# Patient Record
Sex: Female | Born: 1965 | Hispanic: No | Marital: Married | Smoking: Never smoker
Health system: Southern US, Community
[De-identification: ages and names within clinical notes are randomized; demographics above are authoritative.]

## PROBLEM LIST (undated history)

## (undated) DIAGNOSIS — R51 Headache: Secondary | ICD-10-CM

## (undated) DIAGNOSIS — Z973 Presence of spectacles and contact lenses: Secondary | ICD-10-CM

## (undated) DIAGNOSIS — R519 Headache, unspecified: Secondary | ICD-10-CM

## (undated) DIAGNOSIS — J189 Pneumonia, unspecified organism: Secondary | ICD-10-CM

## (undated) DIAGNOSIS — F419 Anxiety disorder, unspecified: Secondary | ICD-10-CM

## (undated) DIAGNOSIS — T7840XA Allergy, unspecified, initial encounter: Secondary | ICD-10-CM

## (undated) DIAGNOSIS — K219 Gastro-esophageal reflux disease without esophagitis: Secondary | ICD-10-CM

## (undated) HISTORY — DX: Allergy, unspecified, initial encounter: T78.40XA

---

## 2005-01-27 ENCOUNTER — Encounter: Admission: RE | Admit: 2005-01-27 | Discharge: 2005-01-27 | Payer: Self-pay | Admitting: Otolaryngology

## 2006-08-29 ENCOUNTER — Ambulatory Visit (HOSPITAL_COMMUNITY): Admission: RE | Admit: 2006-08-29 | Discharge: 2006-08-29 | Payer: Self-pay | Admitting: Chiropractic Medicine

## 2007-08-28 ENCOUNTER — Ambulatory Visit: Payer: Self-pay | Admitting: Family Medicine

## 2008-04-21 ENCOUNTER — Encounter: Admission: RE | Admit: 2008-04-21 | Discharge: 2008-04-21 | Payer: Self-pay | Admitting: Family Medicine

## 2008-09-30 ENCOUNTER — Encounter (INDEPENDENT_AMBULATORY_CARE_PROVIDER_SITE_OTHER): Payer: Self-pay | Admitting: Internal Medicine

## 2008-09-30 ENCOUNTER — Ambulatory Visit: Payer: Self-pay | Admitting: Internal Medicine

## 2008-09-30 LAB — CONVERTED CEMR LAB
ALT: 11 units/L (ref 0–35)
AST: 10 units/L (ref 0–37)
Albumin: 4.8 g/dL (ref 3.5–5.2)
Alkaline Phosphatase: 63 units/L (ref 39–117)
BUN: 11 mg/dL (ref 6–23)
CO2: 25 meq/L (ref 19–32)
Calcium: 9.8 mg/dL (ref 8.4–10.5)
Chloride: 102 meq/L (ref 96–112)
Cholesterol: 201 mg/dL — ABNORMAL HIGH (ref 0–200)
Creatinine, Ser: 0.75 mg/dL (ref 0.40–1.20)
Glucose, Bld: 86 mg/dL (ref 70–99)
HDL: 69 mg/dL (ref >39)
LDL Cholesterol: 107 mg/dL — ABNORMAL HIGH (ref 0–99)
Potassium: 4.1 meq/L (ref 3.5–5.3)
Sodium: 139 meq/L (ref 135–145)
TSH: 0.999 microintl units/mL (ref 0.350–4.500)
Total Bilirubin: 0.6 mg/dL (ref 0.3–1.2)
Total CHOL/HDL Ratio: 2.9
Total Protein: 7.6 g/dL (ref 6.0–8.3)
Triglycerides: 126 mg/dL (ref <150)
VLDL: 25 mg/dL (ref 0–40)

## 2008-10-28 ENCOUNTER — Ambulatory Visit: Payer: Self-pay | Admitting: Internal Medicine

## 2008-11-04 ENCOUNTER — Ambulatory Visit (HOSPITAL_COMMUNITY): Admission: RE | Admit: 2008-11-04 | Discharge: 2008-11-04 | Payer: Self-pay | Admitting: Internal Medicine

## 2009-01-28 ENCOUNTER — Ambulatory Visit: Payer: Self-pay | Admitting: Internal Medicine

## 2009-01-29 ENCOUNTER — Encounter (INDEPENDENT_AMBULATORY_CARE_PROVIDER_SITE_OTHER): Payer: Self-pay | Admitting: Internal Medicine

## 2009-02-04 ENCOUNTER — Ambulatory Visit: Payer: Self-pay | Admitting: *Deleted

## 2009-04-07 ENCOUNTER — Ambulatory Visit: Payer: Self-pay | Admitting: Obstetrics and Gynecology

## 2009-05-05 ENCOUNTER — Ambulatory Visit: Payer: Self-pay | Admitting: Internal Medicine

## 2009-07-08 ENCOUNTER — Ambulatory Visit: Payer: Self-pay | Admitting: Obstetrics and Gynecology

## 2009-07-19 ENCOUNTER — Encounter: Admission: RE | Admit: 2009-07-19 | Discharge: 2009-07-19 | Payer: Self-pay | Admitting: Obstetrics and Gynecology

## 2009-08-20 ENCOUNTER — Encounter: Admission: RE | Admit: 2009-08-20 | Discharge: 2009-08-20 | Payer: Self-pay | Admitting: Infectious Diseases

## 2009-11-05 ENCOUNTER — Emergency Department (HOSPITAL_COMMUNITY): Admission: EM | Admit: 2009-11-05 | Discharge: 2009-11-05 | Payer: Self-pay | Admitting: Emergency Medicine

## 2010-07-17 ENCOUNTER — Encounter: Payer: Self-pay | Admitting: *Deleted

## 2010-07-17 ENCOUNTER — Encounter: Payer: Self-pay | Admitting: Family Medicine

## 2010-11-07 ENCOUNTER — Other Ambulatory Visit: Payer: Self-pay | Admitting: Family Medicine

## 2010-11-07 DIAGNOSIS — Z1231 Encounter for screening mammogram for malignant neoplasm of breast: Secondary | ICD-10-CM

## 2010-11-14 ENCOUNTER — Other Ambulatory Visit: Payer: Self-pay | Admitting: Family Medicine

## 2010-11-14 ENCOUNTER — Ambulatory Visit
Admission: RE | Admit: 2010-11-14 | Discharge: 2010-11-14 | Disposition: A | Discharge status: Home / Self Care | Payer: 59 | Source: Ambulatory Visit | Attending: Family Medicine | Admitting: Family Medicine

## 2010-11-14 DIAGNOSIS — Z1231 Encounter for screening mammogram for malignant neoplasm of breast: Secondary | ICD-10-CM

## 2011-03-20 ENCOUNTER — Ambulatory Visit
Admission: RE | Admit: 2011-03-20 | Discharge: 2011-03-20 | Disposition: A | Discharge status: Home / Self Care | Payer: PRIVATE HEALTH INSURANCE | Source: Ambulatory Visit | Attending: Infectious Diseases | Admitting: Infectious Diseases

## 2011-03-20 ENCOUNTER — Other Ambulatory Visit: Payer: Self-pay | Admitting: Infectious Diseases

## 2011-03-20 DIAGNOSIS — R9389 Abnormal findings on diagnostic imaging of other specified body structures: Secondary | ICD-10-CM

## 2011-10-31 ENCOUNTER — Other Ambulatory Visit: Payer: Self-pay | Admitting: Family Medicine

## 2011-10-31 DIAGNOSIS — Z1231 Encounter for screening mammogram for malignant neoplasm of breast: Secondary | ICD-10-CM

## 2011-11-28 ENCOUNTER — Other Ambulatory Visit (HOSPITAL_COMMUNITY): Payer: Self-pay | Admitting: Gastroenterology

## 2011-11-28 DIAGNOSIS — R1032 Left lower quadrant pain: Secondary | ICD-10-CM

## 2011-11-30 ENCOUNTER — Other Ambulatory Visit (HOSPITAL_COMMUNITY): Payer: Self-pay | Admitting: Gastroenterology

## 2011-11-30 ENCOUNTER — Ambulatory Visit (HOSPITAL_COMMUNITY)
Admission: RE | Admit: 2011-11-30 | Discharge: 2011-11-30 | Disposition: A | Discharge status: Home / Self Care | Payer: 59 | Source: Ambulatory Visit | Attending: Gastroenterology | Admitting: Gastroenterology

## 2011-11-30 DIAGNOSIS — R1032 Left lower quadrant pain: Secondary | ICD-10-CM

## 2011-11-30 DIAGNOSIS — Z79899 Other long term (current) drug therapy: Secondary | ICD-10-CM | POA: Insufficient documentation

## 2011-12-04 ENCOUNTER — Ambulatory Visit (HOSPITAL_COMMUNITY)
Admission: RE | Admit: 2011-12-04 | Discharge: 2011-12-04 | Disposition: A | Discharge status: Home / Self Care | Payer: 59 | Source: Ambulatory Visit | Attending: Gastroenterology | Admitting: Gastroenterology

## 2011-12-04 ENCOUNTER — Ambulatory Visit: Payer: PRIVATE HEALTH INSURANCE

## 2011-12-04 DIAGNOSIS — R1032 Left lower quadrant pain: Secondary | ICD-10-CM | POA: Insufficient documentation

## 2011-12-07 ENCOUNTER — Ambulatory Visit
Admission: RE | Admit: 2011-12-07 | Discharge: 2011-12-07 | Disposition: A | Discharge status: Home / Self Care | Payer: 59 | Source: Ambulatory Visit | Attending: Family Medicine | Admitting: Family Medicine

## 2011-12-07 DIAGNOSIS — Z1231 Encounter for screening mammogram for malignant neoplasm of breast: Secondary | ICD-10-CM

## 2012-07-29 ENCOUNTER — Other Ambulatory Visit: Payer: Self-pay | Admitting: Family Medicine

## 2012-07-29 ENCOUNTER — Ambulatory Visit
Admission: RE | Admit: 2012-07-29 | Discharge: 2012-07-29 | Disposition: A | Discharge status: Home / Self Care | Payer: 59 | Source: Ambulatory Visit | Attending: Family Medicine | Admitting: Family Medicine

## 2012-07-29 DIAGNOSIS — R61 Generalized hyperhidrosis: Secondary | ICD-10-CM

## 2012-12-02 ENCOUNTER — Other Ambulatory Visit (HOSPITAL_BASED_OUTPATIENT_CLINIC_OR_DEPARTMENT_OTHER): Payer: Self-pay | Admitting: Family Medicine

## 2012-12-02 DIAGNOSIS — Z1231 Encounter for screening mammogram for malignant neoplasm of breast: Secondary | ICD-10-CM

## 2012-12-09 ENCOUNTER — Other Ambulatory Visit: Payer: Self-pay

## 2012-12-09 ENCOUNTER — Ambulatory Visit (HOSPITAL_BASED_OUTPATIENT_CLINIC_OR_DEPARTMENT_OTHER): Payer: 59

## 2012-12-09 ENCOUNTER — Ambulatory Visit
Admission: RE | Admit: 2012-12-09 | Discharge: 2012-12-09 | Disposition: A | Discharge status: Home / Self Care | Payer: 59 | Source: Ambulatory Visit

## 2012-12-09 DIAGNOSIS — Z1231 Encounter for screening mammogram for malignant neoplasm of breast: Secondary | ICD-10-CM

## 2012-12-10 ENCOUNTER — Other Ambulatory Visit: Payer: Self-pay | Admitting: Family Medicine

## 2012-12-10 DIAGNOSIS — R928 Other abnormal and inconclusive findings on diagnostic imaging of breast: Secondary | ICD-10-CM

## 2012-12-24 ENCOUNTER — Ambulatory Visit
Admission: RE | Admit: 2012-12-24 | Discharge: 2012-12-24 | Disposition: A | Discharge status: Home / Self Care | Payer: 59 | Source: Ambulatory Visit | Attending: Family Medicine | Admitting: Family Medicine

## 2012-12-24 DIAGNOSIS — R928 Other abnormal and inconclusive findings on diagnostic imaging of breast: Secondary | ICD-10-CM

## 2013-07-30 ENCOUNTER — Ambulatory Visit (INDEPENDENT_AMBULATORY_CARE_PROVIDER_SITE_OTHER): Payer: BC Managed Care – PPO | Admitting: Physician Assistant

## 2013-07-30 VITALS — BP 110/70 | HR 88 | Temp 98.1°F | Resp 16 | Ht 64.25 in | Wt 110.0 lb

## 2013-07-30 DIAGNOSIS — R69 Illness, unspecified: Principal | ICD-10-CM

## 2013-07-30 DIAGNOSIS — R05 Cough: Secondary | ICD-10-CM

## 2013-07-30 DIAGNOSIS — R059 Cough, unspecified: Secondary | ICD-10-CM

## 2013-07-30 DIAGNOSIS — J111 Influenza due to unidentified influenza virus with other respiratory manifestations: Secondary | ICD-10-CM

## 2013-07-30 DIAGNOSIS — R509 Fever, unspecified: Secondary | ICD-10-CM

## 2013-07-30 LAB — POCT CBC
Granulocyte percent: 64.3 %G (ref 37–80)
HCT, POC: 36.4 % — AB (ref 37.7–47.9)
Hemoglobin: 10.9 g/dL — AB (ref 12.2–16.2)
Lymph, poc: 1.8 (ref 0.6–3.4)
MCH, POC: 26 pg — AB (ref 27–31.2)
MCHC: 29.9 g/dL — AB (ref 31.8–35.4)
MCV: 86.7 fL (ref 80–97)
MID (cbc): 0.5 (ref 0–0.9)
MPV: 8.3 fL (ref 0–99.8)
POC Granulocyte: 4.2 (ref 2–6.9)
POC LYMPH PERCENT: 27.3 %L (ref 10–50)
POC MID %: 8.4 %M (ref 0–12)
Platelet Count, POC: 303 10*3/uL (ref 142–424)
RBC: 4.2 M/uL (ref 4.04–5.48)
RDW, POC: 14.1 %
WBC: 6.5 10*3/uL (ref 4.6–10.2)

## 2013-07-30 LAB — POCT INFLUENZA A/B
Influenza A, POC: NEGATIVE
Influenza B, POC: NEGATIVE

## 2013-07-30 MED ORDER — IPRATROPIUM BROMIDE 0.06 % NA SOLN: 2.0000 | Freq: Three times a day (TID) | NASAL | Status: DC

## 2013-07-30 MED ORDER — HYDROCODONE-HOMATROPINE 5-1.5 MG/5ML PO SYRP: ORAL_SOLUTION | ORAL | Status: DC

## 2013-07-30 MED ORDER — FLUTICASONE PROPIONATE 50 MCG/ACT NA SUSP: 2.0000 | Freq: Every day | NASAL | Status: AC

## 2013-07-30 MED ORDER — OSELTAMIVIR PHOSPHATE 75 MG PO CAPS: 75.0000 mg | ORAL_CAPSULE | Freq: Two times a day (BID) | ORAL | Status: DC

## 2013-07-30 NOTE — Progress Notes (Signed)
Subjective:    Patient ID: Alexis Rose, female    DOB: 1965/11/05, 48 y.o.   MRN: [id number]  HPI 48 year old female presents with a 7 day history of nasal congestion, post nasal drip, sore throat, and cough. Mild sinus pressure. Subjective fever and chills. Nasal congestion thick and green/yellow. Cough is productive of green/yellow sputum and not associated with time of day. Ears feel full, leading to sensation of muffled hearing. Has tried OTC cold preps without success. No GI complaints. Appetite normal. Son has been sick with influenza with influenza 1 week ago. Husband here being seen now as well with influenza-like symptoms.   No recent antibiotics, or recent travels.   No leg trauma, sedentary periods, h/o cancer, or tobacco use.  PMH: Past Medical History  Diagnosis Date  . Allergy     Home Meds: Prior to Admission medications   Medication Sig Start Date End Date Taking? Authorizing Provider  cetirizine (ZYRTEC) 10 MG tablet Take 10 mg by mouth daily.   Yes Historical Provider, MD    Allergies: No Known Allergies  History   Social History  . Marital Status: Married    Spouse Name: N/A    Number of Children: N/A  . Years of Education: N/A   Occupational History  . Not on file.   Social History Main Topics  . Smoking status: Never Smoker   . Smokeless tobacco: Not on file  . Alcohol Use: No  . Drug Use: No  . Sexual Activity: Not on file   Other Topics Concern  . Not on file   Social History Narrative  . No narrative on file      Review of Systems  Constitutional: Positive for fever, chills and fatigue. Negative for appetite change.  HENT: Positive for congestion, ear pain, hearing loss, postnasal drip, rhinorrhea, sinus pressure and sore throat.        Nasal congestion.  Frontal sinus pressure.  Mild left otalgia.   Respiratory: Positive for cough and wheezing. Negative for shortness of breath.        Cough is productive of yellow sputum. Cough  is not associated with time of day.   Gastrointestinal: Positive for nausea. Negative for vomiting and diarrhea.  Musculoskeletal: Positive for myalgias.  Allergic/Immunologic: Positive for environmental allergies.  Neurological: Positive for headaches.       Objective:   Physical Exam  Physical Exam: Blood pressure 110/70, pulse 88, temperature 98.1 F (36.7 C), temperature source Oral, resp. rate 16, height 5' 4.25" (1.632 m), weight 110 lb (49.896 kg), SpO2 98.00%., Body mass index is 18.73 kg/(m^2). General: Well developed, well nourished, in no acute distress. Head: Normocephalic, atraumatic, eyes without discharge, sclera non-icteric, nares are congested. Bilateral auditory canals clear, TM's are without perforation, pearly grey with reflective cone of light bilaterally. No sinus TTP. Oral cavity moist, dentition normal. Posterior pharynx with post nasal drip and mild erythema. No peritonsillar abscess or tonsillar exudate. Uvula midline.  Neck: Supple. No thyromegaly. Full ROM. No lymphadenopathy. Lungs: Clear to auscultation bilaterally without wheezes, rales, or rhonchi. Breathing is unlabored.  Heart: RRR with S1 S2. No murmurs, rubs, or gallops appreciated. Msk:  Strength and tone normal for age. Extremities: No clubbing or cyanosis. No edema. Neuro: Alert and oriented X 3. Moves all extremities spontaneously. CNII-XII grossly in tact. Psych:  Responds to questions appropriately with a normal affect.   Labs: Results for orders placed in visit on 07/30/13  POCT INFLUENZA A/B  Result Value Range   Influenza A, POC Negative     Influenza B, POC Negative    POCT CBC      Result Value Range   WBC 6.5  4.6 - 10.2 K/uL   Lymph, poc 1.8  0.6 - 3.4   POC LYMPH PERCENT 27.3  10 - 50 %L   MID (cbc) 0.5  0 - 0.9   POC MID % 8.4  0 - 12 %M   POC Granulocyte 4.2  2 - 6.9   Granulocyte percent 64.3  37 - 80 %G   RBC 4.20  4.04 - 5.48 M/uL   Hemoglobin 10.9 (*) 12.2 - 16.2 g/dL    HCT, POC 36.4 (*) 37.7 - 47.9 %   MCV 86.7  80 - 97 fL   MCH, POC 26.0 (*) 27 - 31.2 pg   MCHC 29.9 (*) 31.8 - 35.4 g/dL   RDW, POC 14.1     Platelet Count, POC 303  142 - 424 K/uL   MPV 8.3  0 - 99.8 fL       Assessment & Plan:  48 year old female with influenza-like illness, cough, and fever -Tamiflu 75 mg 1 po bid #10 no RF -Hycodan #4oz 1 tsp po q 4-6 hours prn cough no RF SED -Atrovent NS 0.06% 2 sprays each nare bid prn #1 no RF -Flonase 2 sprays each nare daily #1 RF 5 -Tylenol/Motrin -Rest/fluids -RTC precautions    Christell Faith, MHS, PA-C [facility] [place], [place] [address] [place] 07/30/2013 3:31 PM

## 2014-01-12 ENCOUNTER — Ambulatory Visit (INDEPENDENT_AMBULATORY_CARE_PROVIDER_SITE_OTHER): Payer: BC Managed Care – PPO | Admitting: Family Medicine

## 2014-01-12 VITALS — BP 106/64 | HR 78 | Temp 98.4°F | Resp 18 | Ht 64.75 in | Wt 112.8 lb

## 2014-01-12 DIAGNOSIS — J0120 Acute ethmoidal sinusitis, unspecified: Secondary | ICD-10-CM

## 2014-01-12 DIAGNOSIS — J309 Allergic rhinitis, unspecified: Secondary | ICD-10-CM

## 2014-01-12 DIAGNOSIS — J302 Other seasonal allergic rhinitis: Secondary | ICD-10-CM

## 2014-01-12 DIAGNOSIS — K219 Gastro-esophageal reflux disease without esophagitis: Secondary | ICD-10-CM

## 2014-01-12 MED ORDER — OMEPRAZOLE 20 MG PO CPDR
20.0000 mg | DELAYED_RELEASE_CAPSULE | Freq: Every day | ORAL | Status: DC
Start: 2014-01-12 — End: 2014-08-31

## 2014-01-12 MED ORDER — FEXOFENADINE HCL 180 MG PO TABS: 180.0000 mg | ORAL_TABLET | Freq: Every day | ORAL | Status: AC

## 2014-01-12 MED ORDER — AMOXICILLIN-POT CLAVULANATE 875-125 MG PO TABS: 1.0000 | ORAL_TABLET | Freq: Two times a day (BID) | ORAL | Status: DC

## 2014-01-12 NOTE — Progress Notes (Signed)
Chief Complaint:  Chief Complaint  Patient presents with  . Nasal Congestion    started 2 days ago--headache--chest pressure--runny & stuffy nose--yellowish drainage--chills--no fever--nausea    HPI: Alexis Alexis Rose Alexis Rose is a 48 y.o. female who is here for sinus HA and has chills and subjective fevers x 4 days, she has some chest pressure but from draiange down her throat. . She has no cough but she has scratchy throat.  No CP.  Has itchiness in her ears. She took allergy meds : flonase and also zyrtec. She has had bloody noses with the flonase.  Also has GERD sxs, eats foods tat are acidic and alos late at night, sxs are worse when she does this.    Past Medical History  Diagnosis Date  . Allergy    Past Surgical History  Procedure Laterality Date  . Cesarean section     History   Social History  . Marital Status: Married    Spouse Name: N/A    Number of Children: N/A  . Years of Education: N/A   Social History Main Topics  . Smoking status: Never Smoker   . Smokeless tobacco: None  . Alcohol Use: No  . Drug Use: No  . Sexual Activity: None   Other Topics Concern  . None   Social History Narrative  . None   Family History  Problem Relation Age of Onset  . High Cholesterol Mother    No Known Allergies Prior to Admission medications   Medication Sig Start Date End Date Taking? Authorizing Provider  cetirizine (ZYRTEC) 10 MG tablet Take 10 mg by mouth daily.    Historical Provider, MD  fluticasone (FLONASE) 50 MCG/ACT nasal spray Place 2 sprays into both nostrils daily. 07/30/13   Alexis M Dunn, PA-C  HYDROcodone-homatropine (HYCODAN) 5-1.5 MG/5ML syrup 1 TSP PO Q 4-6 HOURS PRN COUGH 07/30/13   Alexis Haber Dunn, PA-C  ipratropium (ATROVENT) 0.06 % nasal spray Place 2 sprays into the nose 3 (three) times daily. 07/30/13   Alexis Mu, PA-C  oseltamivir (TAMIFLU) 75 MG capsule Take 1 capsule (75 mg total) by mouth 2 (two) times daily. 07/30/13   Alexis Mu, PA-C     ROS:  The patient denies current  fevers, chills, night sweats, unintentional weight loss, chest pain, palpitations, wheezing, dyspnea on exertion, nausea, vomiting, abdominal pain, dysuria, hematuria, melena, numbness, weakness, or tingling.   All other systems have been reviewed and were otherwise negative with the exception of those mentioned in the HPI and as above.    PHYSICAL EXAM: Filed Vitals:   01/12/14 1059  BP: 106/64  Pulse: 78  Temp: 98.4 F (36.9 C)  Resp: 18   Filed Vitals:   01/12/14 1059  Height: 5' 4.75" (1.645 m)  Weight: 112 lb 12.8 oz (51.166 kg)   Body mass index is 18.91 kg/(m^2).  General: Alert, no acute distress HEENT:  Normocephalic, atraumatic, oropharynx patent. EOMI, PERRLA, TM nl, + sinus tenderness, No exudates.  Cardiovascular:  Regular rate and rhythm, no rubs murmurs or gallops.  No Carotid bruits, radial pulse intact. No pedal edema.  Respiratory: Clear to auscultation bilaterally.  No wheezes, rales, or rhonchi.  No cyanosis, no use of accessory musculature GI: No organomegaly, abdomen is soft and non-tender, positive bowel sounds.  No masses. Skin: No rashes. Neurologic: Facial musculature symmetric. Psychiatric: Patient is appropriate throughout our interaction. Lymphatic: No cervical lymphadenopathy Musculoskeletal: Gait intact.   LABS: Results for orders placed  in visit on 07/30/13  POCT INFLUENZA A/B      Result Value Ref Range   Influenza A, POC Negative     Influenza B, POC Negative    POCT CBC      Result Value Ref Range   WBC 6.5  4.6 - 10.2 K/uL   Lymph, poc 1.8  0.6 - 3.4   POC LYMPH PERCENT 27.3  10 - 50 %L   MID (cbc) 0.5  0 - 0.9   POC MID % 8.4  0 - 12 %M   POC Granulocyte 4.2  2 - 6.9   Granulocyte percent 64.3  37 - 80 %G   RBC 4.20  4.04 - 5.48 M/uL   Hemoglobin 10.9 (*) 12.2 - 16.2 g/dL   HCT, POC 36.4 (*) 37.7 - 47.9 %   MCV 86.7  80 - 97 fL   MCH, POC 26.0 (*) 27 - 31.2 pg   MCHC 29.9 (*) 31.8 - 35.4 g/dL   RDW,  POC 14.1     Platelet Count, POC 303  142 - 424 K/uL   MPV 8.3  0 - 99.8 fL     EKG/XRAY:   Primary read interpreted by Dr. Marin Alexis Rose at [facility].   ASSESSMENT/PLAN: Encounter Diagnoses  Name Primary?  . Acute ethmoidal sinusitis, recurrence not specified Yes  . Gastroesophageal reflux disease without esophagitis   . Seasonal allergies    Rx Augmentin Rx Allegra and prilosec F/u prn  Gross sideeffects, risk and benefits, and alternatives of medications d/w patient. Patient is aware that all medications have potential sideeffects and we are unable to predict every sideeffect or drug-drug interaction that may occur.  Alexis Alexis Rose Alexis Rose, Alexis Alexis Rose [place], DO 01/12/2014 12:21 PM

## 2014-01-12 NOTE — Patient Instructions (Signed)
B?nh Tro Ng??c D? Dy Th?c Qu?n, Ng??i L?n (Gastroesophageal Reflux Diseaes, Adult) B?nh tro ng??c d? dy th?c qu?n (GERD) x?y ra khi axit t? d? dy tro ln th?c qu?n. Khi axit ti?p xc v?i th?c qu?n, axit gy ra ?au (vim) trong th?c qu?n. Theo th?i gian, GERD c th? t?o ra cc l? nh? (cc v?t lot) ? nim m?c th?c qu?n.  NGUYN NHN  Tr?ng l??ng c? th? t?ng. ?i?u ny t?o p l?c ln d? dy, lm t?ng axit t? d? dy vo th?c qu?n.  Ht thu?c l. Ht thu?c l lm t?ng s?n sinh axit trong d? dy.  U?ng r??u. ?y l nguyn nhn lm gi?m p l?c trong c? th?t th?c qu?n d??i (van ho?c vng c? gi?a th?c qu?n v d? dy), cho php axit t? d? dy vo th?c qu?n.  ?n t?i mu?n v b?ng no. Tnh tr?ng ny lm t?ng p l?c c?ng nh? t?ng s?n sinh axit trong d? dy.  D? t?t c? th?t th?c qu?n d??i. ?i khi khng tm th?y nguyn nhn. TRI?U CH?NG  ?au rt ? ph?n d??i gi?a ng?c pha sau x??ng ?c v ? khu v?c gi?a d? dy. Hi?n t??ng ny c th? x?y ra hai l?n m?t tu?n ho?c th??ng xuyn h?n.  Kh nu?t.  ?au h?ng.  Ho khan.  Cc tri?u ch?ng gi?ng hen suy?n, bao g?m t?c ng?c,kh th? ho?c th? kh kh. CH?N ?ON Chuyn gia ch?m [place] s?c kh?e c th? ch?n ?on GERD d?a trn cc tri?u ch?ng c?a b?n. Trong m?t s? tr??ng h?p, ch?p X quang v cc xt nghi?m khc c th? ???c ti?n hnh ?? ki?m tra cc bi?n ch?ng ho?c tnh tr?ng c?a d? dy v th?c qu?n. ?I?U TR? Chuyn gia ch?m [place] s?c kh?e c th? khuy?n ngh? dng thu?c khng c?n k ??n ho?c thu?c c?n k ??n ?? gip gi?m s?n sinh axit. Hy h?i chuyn gia ch?m [place] s?c kh?e c?a b?n tr??c khi b?t ??u ho?c dng thm b?t k? lo?i thu?c m?i no. H??NG D?N CH?M [place] T?I NH  Thay ??i cc y?u t? m b?n c th? ki?m sot ???c. H?i chuyn gia ch?m [place] s?c kh?e ?? ???c h??ng d?n v? vi?c gi?m cn, b? thu?c l v s? d?ng r??u.  Hessie Diener cc lo?i th?c ph?m v ?? u?ng lm cho cc tri?u ch?ng t?i t? h?n, ch?ng h?n nh?:  ?? u?ng c caffeine ho?c r??u.  S c la.  B?c h ho?c v? b?c  h.  T?i v hnh ty.  Th?c ?n [place].  Tri cy h? cam, ch?ng h?n nh? cam, chanh hay chanh ty.  Cc th?c ?n c c chua, ch?ng h?n nh? n??c x?t, ?t, salsa (n??c x?t [place]) v bnh pizza.  Cc lo?i th?c ?n chin xo v nhi?u ch?t bo.  Trnh n?m xu?ng ng? 3 ti?ng tr??c gi? ?i ng? ho?c tr??c khi c m?t gi?c ng? ng?n.  ?n nh?ng b?a ?n nh?, th??ng xuyn h?n thay v cc b?a ?n l?n.  M?c qu?n o r?ng. Khng ?eo b?t c? th? g ch?t quanh th?t l?ng gy p l?c ln d? dy.  Nng ??u gi??ng cao ln t? 6 ??n 8 inch b?ng cc kh?i g? ?? gip b?n ng?. S? d?ng thm g?i s? khng c tc d?ng.  Ch? s? d?ng thu?c khng c?n k ??n ho?c thu?c c?n k ??n ?? gi?m ?au, gi?m c?m gic kh ch?u ho?c h? s?t theo ch? d?n c?a chuyn gia ch?m [place] s?c kh?e c?a b?n.  Khng dng thu?c atpirin, ibuprofen ho?c cc thu?c ch?ng vim khng c steroid (NSAID) khc. HY NGAY L?P T?C ?I KHM N?U:  B?n b? ?au ? cnh tay, c?, hm, r?ng ho?c l?ng.  Hi?n t??ng ?au t?ng ln ho?c thay ??i theo c??ng ?? ho?c th?i gian.  B?n b? bu?n nn, nn ho?c ?? m? hi (tot m? hi).  B?n b? kh th? ho?c ng?t x?u.  Ch?t nn c mu xanh l cy, vng, ?en ho?c trng gi?ng nh? b c ph ho?c mu.  Phn c mu ??, ?? nh? mu ho?c ?en. Nh?ng tri?u ch?ng ny c th? l d?u hi?u c?a cc v?n ?? khc, ch?ng h?n nh? b?nh tim, ch?y mu d? dy ho?c ch?y mu th?c qu?n. ??M B?O B?N:  Hi?u cc h??ng d?n ny.  S? theo di tnh tr?ng c?a mnh.  S? yu c?u tr? gip ngay l?p t?c n?u b?n c?m th?y khng ?? ho?c tnh tr?ng tr?m tr?ng h?n. Document Released: 03/22/2005 Document Revised: 02/12/2013 [facility] Patient Information 2015 [place]. This information is not intended to replace advice given to you by your health care provider. Make sure you discuss any questions you have with your health care provider. Vim xoang (Sinusitis) Vim xoang l hi?n t??ng t?y ??, ?au nh?c v s?ng (vim) ? cc xoang c?nh m?i. Xoang c?nh m?i l cc ti kh trong x??ng m?t  (bn d??i m?t, gi?a trn ho?c trn m?t). Trong cc xoang c?nh m?i kh?e m?nh, d?ch nh?y c th? thot ra ngoi v khng kh c th? l?u thng qua chng theo ???ng m?i. Tuy nhin, khi cc xoang c?nh m?i b? vim, d?ch nh?y v khng kh c th? b? m?c k?t. ?i?u ny c th? lm cho vi khu?n v vi trng khc pht tri?n v gy nhi?m trng. Vim xoang c th? pht tri?n m?t cch nhanh chng v ko di trong m?t th?i gian ng?n (c?p tnh) ho?c ti?p t?c trong th?i gian di (m?n tnh). Vim xoang ko di h?n 12 tu?n ???c coi l m?n tnh.  NGUYN NHN Nguyn nhn vim xoang bao g?m:  D? ?ng.  D? d?ng c?u trc, ch?ng h?n nh? d?ch chuy?n v? tr c?a s?n phn cch l? m?i (l?ch vch ng?n), c th? lm gi?m lu?ng khng kh qua m?i c?ng nh? cc xoang v ?nh h??ng ??n kh? n?ng d?n l?u c?a xoang.  B?t th??ng v? ch?c n?ng, ch?ng h?n nh? khi cc s?i lng nh? (lng mao) ph? cc xoang v gip lo?i b? d?ch nh?y khng ho?t ??ng ?ng ho?c khng c. TRI?U CH?NG Cc tri?u ch?ng c?a vim xoang c?p tnh v m?n tnh ??u gi?ng nhau. Cc tri?u ch?ng chnh l ?au v p l?c xung quanh xoang b? ?nh h??ng. Cc tri?u ch?ng khc bao g?m:  ?au r?ng trn.  ?au tai.  ?au ??u.  H?i th? hi.  Suy gi?m thnh gic v v? gic.  Ho n?ng h?n khi n?m.  M?t m?i.  S?t.  R? d?ch ??c t? m?i, th??ng c mu xanh v c th? ch?a m?.  S?ng v ?m h?n ? cc xoang b? ?nh h??ng. CH?N ?ON Chuyn gia ch?m [place] s?c kh?e s? khm th?c th?[place] Robert qu trnh Jacqulyn Liner, chuyn gia ch?m [place] s?c kh?e c th?:  Soi m?i c?a b?n xem c cc d?u hi?u c?a c?c u b?t th??ng trong l? m?i (polyp m?i) khng.  G vo cc xoang b? ?nh h??ng ?? ki?m tra d?u hi?u nhi?m trng.  Xem bn trong cc xoang (n?i  soi) b?ng m?t thi?t b? hnh ?nh ??c bi?t c g?n ?n (n?i soi) ???c ??a vo xoang. N?u chuyn gia ch?m [place] s?c kh?e nghi ng? r?ng b?n b? vim xoang m?n tnh, m?t ho?c nhi?u xt nghi?m sau ?y c th? ???c khuy?n ngh?:  Xt nghi?m d? ?ng.  L?y m?u c?y ? m?i-M?t m?u d?ch nh?y  ???c l?y t? m?i c?a b?n v g?i ??n phng th nghi?m ?? ki?m tra vi khu?n.  T? bo h?c ? m?i-M?t m?u d?ch nh?y ???c l?y t? m?i c?a b?n v xt nghi?m b?i chuyn gia ch?m [place] s?c kh?e ?? xc ??nh xem tnh tr?ng vim xoang c?a b?n c lin quan ??n d? ?ng hay khng. ?I?U TR? H?u h?t cc tr??ng h?p vim xoang c?p tnh c lin quan ??n nhi?m vi rt v s? t? kh?i trong vng 10 ngy. ?i khi thu?c ???c k ??n ?? gip lm gi?m cc tri?u ch?ng (thu?c gi?m ?au, thu?c ch?ng sung huy?t m?i, thu?c x?t m?i steroid ho?c bnh x?t n??c mu?i). Tuy nhin, v?i vim xoang lin quan ??n nhi?m vi khu?n, chuyn gia ch?m [place] s?c kh?e s? k thu?c khng sinh. ?y l nh?ng lo?i thu?c s? gip tiu di?t vi khu?n gy nhi?m trng. Trong tr??ng h?p hi?m g?p, vim xoang do nhi?m n?m. Trong nh?ng tr??ng h?p ny, chuyn gia ch?m [place] s?c kh?e s? k thu?c khng n?m. M?t s? tr??ng h?p vim xoang m?n tnh s? c?n ph?u thu?t. Ni chung, ?y l nh?ng tr??ng h?p vim xoang ti pht trn 3 l?n m?i n?m, m?c d ? th?c hi?n cc ph??ng php ?i?u tr? khc. H??NG D?N CH?M [place] T?I NH  U?ng th?t nhi?u n??c. N??c gip lm long d?ch nh?y ?? xoang c th? d?n l?u ra d? dng h?n.  S? d?ng my t?o ?m.  Ht h?i n??c 3 ??n 4 l?n m?t ngy (v d?, ng?i trong phng t?m v?i vi sen ?ang ch?y).  ??t kh?n ?m, ?m ln m?t 3 ??n 4 l?n m?t ngy, ho?c theo ch? d?n c?a chuyn gia ch?m [place] s?c kh?e.  S? d?ng bnh x?t m?i ch?a n??c mu?i ?? gip lm ?m v lm s?ch xoang.  Ch? s? d?ng thu?c khng c?n k toa ho?c thu?c c?n k toa ?? gi?m ?au, gi?m c?m gic kh ch?u ho?c h? s?t theo ch? d?n c?a chuyn gia ch?m [place] s?c kh?e c?a b?n. HY NGAY L?P T?C ?I KHM N?U:  B?n b? ?au gia t?ng ho?c ?au ??u n?ng.  B?n b? bu?n nn, nn m?a ho?c bu?n ng?.  B?n b? s?ng xung quanh m?t.  B?n c v?n ?? v? th? l?c.  B?n b? c?ng c?.  B?n b? kh th?. ??M B?O B?N:  Hi?u cc h??ng d?n ny.  S? theo di tnh tr?ng c?a mnh.  S? yu c?u tr? gip ngay l?p t?c n?u b?n c?m th?y  khng ?? ho?c tnh tr?ng tr?m tr?ng h?n. Document Released: 12/12/2011 Document Revised: 02/12/2013 [facility] Patient Information 2015 [place]. This information is not intended to replace advice given to you by your health care provider. Make sure you discuss any questions you have with your health care provider.

## 2014-02-02 ENCOUNTER — Other Ambulatory Visit: Payer: Self-pay

## 2014-02-02 DIAGNOSIS — Z1231 Encounter for screening mammogram for malignant neoplasm of breast: Secondary | ICD-10-CM

## 2014-02-10 ENCOUNTER — Ambulatory Visit: Payer: PRIVATE HEALTH INSURANCE

## 2014-02-11 ENCOUNTER — Ambulatory Visit
Admission: RE | Admit: 2014-02-11 | Discharge: 2014-02-11 | Disposition: A | Discharge status: Home / Self Care | Payer: BC Managed Care – PPO | Source: Ambulatory Visit

## 2014-02-11 DIAGNOSIS — Z1231 Encounter for screening mammogram for malignant neoplasm of breast: Secondary | ICD-10-CM

## 2014-08-31 ENCOUNTER — Encounter (HOSPITAL_BASED_OUTPATIENT_CLINIC_OR_DEPARTMENT_OTHER): Payer: Self-pay | Admitting: *Deleted

## 2014-08-31 ENCOUNTER — Other Ambulatory Visit: Payer: Self-pay | Admitting: Orthopedic Surgery

## 2014-08-31 NOTE — Progress Notes (Signed)
No labs needed

## 2014-09-02 ENCOUNTER — Ambulatory Visit (HOSPITAL_BASED_OUTPATIENT_CLINIC_OR_DEPARTMENT_OTHER): Payer: 59 | Admitting: Anesthesiology

## 2014-09-02 ENCOUNTER — Ambulatory Visit (HOSPITAL_BASED_OUTPATIENT_CLINIC_OR_DEPARTMENT_OTHER)
Admission: RE | Admit: 2014-09-02 | Discharge: 2014-09-02 | Disposition: A | Discharge status: Home / Self Care | Payer: 59 | Source: Ambulatory Visit | Attending: Orthopedic Surgery | Admitting: Orthopedic Surgery

## 2014-09-02 ENCOUNTER — Encounter (HOSPITAL_BASED_OUTPATIENT_CLINIC_OR_DEPARTMENT_OTHER): Payer: Self-pay | Admitting: Anesthesiology

## 2014-09-02 ENCOUNTER — Encounter (HOSPITAL_BASED_OUTPATIENT_CLINIC_OR_DEPARTMENT_OTHER)
Admission: RE | Disposition: A | Discharge status: Home / Self Care | Payer: Self-pay | Source: Ambulatory Visit | Attending: Orthopedic Surgery

## 2014-09-02 DIAGNOSIS — Y929 Unspecified place or not applicable: Secondary | ICD-10-CM | POA: Insufficient documentation

## 2014-09-02 DIAGNOSIS — M79644 Pain in right finger(s): Secondary | ICD-10-CM | POA: Diagnosis present

## 2014-09-02 DIAGNOSIS — Y999 Unspecified external cause status: Secondary | ICD-10-CM | POA: Diagnosis not present

## 2014-09-02 DIAGNOSIS — S62620A Displaced fracture of medial phalanx of right index finger, initial encounter for closed fracture: Secondary | ICD-10-CM | POA: Insufficient documentation

## 2014-09-02 DIAGNOSIS — Y939 Activity, unspecified: Secondary | ICD-10-CM | POA: Insufficient documentation

## 2014-09-02 DIAGNOSIS — X58XXXA Exposure to other specified factors, initial encounter: Secondary | ICD-10-CM | POA: Diagnosis not present

## 2014-09-02 HISTORY — DX: Presence of spectacles and contact lenses: Z97.3

## 2014-09-02 HISTORY — PX: CLOSED REDUCTION FINGER WITH PERCUTANEOUS PINNING: SHX5612

## 2014-09-02 SURGERY — CLOSED REDUCTION, FINGER, WITH PERCUTANEOUS PINNING
Anesthesia: General | Site: Finger | Laterality: Right

## 2014-09-02 MED ORDER — FENTANYL CITRATE 0.05 MG/ML IJ SOLN
INTRAMUSCULAR | Status: DC | PRN
  Administered 2014-09-02 (×2): 50 ug via INTRAVENOUS

## 2014-09-02 MED ORDER — FENTANYL CITRATE 0.05 MG/ML IJ SOLN
INTRAMUSCULAR | Status: AC
  Filled 2014-09-02: qty 4

## 2014-09-02 MED ORDER — BUPIVACAINE HCL (PF) 0.25 % IJ SOLN
INTRAMUSCULAR | Status: DC | PRN
  Administered 2014-09-02: 3 mL

## 2014-09-02 MED ORDER — OXYCODONE HCL 5 MG/5ML PO SOLN: 5.0000 mg | Freq: Once | ORAL | Status: DC | PRN

## 2014-09-02 MED ORDER — PROMETHAZINE HCL 25 MG/ML IJ SOLN: 6.2500 mg | INTRAMUSCULAR | Status: DC | PRN

## 2014-09-02 MED ORDER — CEFAZOLIN SODIUM-DEXTROSE 2-3 GM-% IV SOLR
2.0000 g | INTRAVENOUS | Status: AC
  Administered 2014-09-02: 2 g via INTRAVENOUS

## 2014-09-02 MED ORDER — HYDROMORPHONE HCL 1 MG/ML IJ SOLN: 0.2500 mg | INTRAMUSCULAR | Status: DC | PRN

## 2014-09-02 MED ORDER — CEFAZOLIN SODIUM-DEXTROSE 2-3 GM-% IV SOLR
INTRAVENOUS | Status: AC
  Filled 2014-09-02: qty 50

## 2014-09-02 MED ORDER — CHLORHEXIDINE GLUCONATE 4 % EX LIQD: 60.0000 mL | Freq: Once | CUTANEOUS | Status: DC

## 2014-09-02 MED ORDER — MIDAZOLAM HCL 2 MG/2ML IJ SOLN
INTRAMUSCULAR | Status: AC
  Filled 2014-09-02: qty 2

## 2014-09-02 MED ORDER — DEXAMETHASONE SODIUM PHOSPHATE 10 MG/ML IJ SOLN
INTRAMUSCULAR | Status: DC | PRN
  Administered 2014-09-02: 10 mg via INTRAVENOUS

## 2014-09-02 MED ORDER — LACTATED RINGERS IV SOLN
INTRAVENOUS | Status: DC
  Administered 2014-09-02: 12:00:00 via INTRAVENOUS

## 2014-09-02 MED ORDER — OXYCODONE-ACETAMINOPHEN 5-325 MG PO TABS: 1.0000 | ORAL_TABLET | ORAL | Status: DC | PRN

## 2014-09-02 MED ORDER — OXYCODONE HCL 5 MG PO TABS: 5.0000 mg | ORAL_TABLET | Freq: Once | ORAL | Status: DC | PRN

## 2014-09-02 MED ORDER — PROPOFOL 10 MG/ML IV BOLUS
INTRAVENOUS | Status: DC | PRN
  Administered 2014-09-02: 160 mg via INTRAVENOUS

## 2014-09-02 MED ORDER — MIDAZOLAM HCL 2 MG/2ML IJ SOLN: 1.0000 mg | INTRAMUSCULAR | Status: DC | PRN

## 2014-09-02 MED ORDER — MIDAZOLAM HCL 5 MG/5ML IJ SOLN
INTRAMUSCULAR | Status: DC | PRN
  Administered 2014-09-02: 2 mg via INTRAVENOUS

## 2014-09-02 MED ORDER — BUPIVACAINE HCL (PF) 0.25 % IJ SOLN
INTRAMUSCULAR | Status: AC
  Filled 2014-09-02: qty 30

## 2014-09-02 MED ORDER — LIDOCAINE HCL (PF) 1 % IJ SOLN
INTRAMUSCULAR | Status: AC
  Filled 2014-09-02: qty 30

## 2014-09-02 MED ORDER — ONDANSETRON HCL 4 MG/2ML IJ SOLN
INTRAMUSCULAR | Status: DC | PRN
  Administered 2014-09-02: 4 mg via INTRAVENOUS

## 2014-09-02 MED ORDER — FENTANYL CITRATE 0.05 MG/ML IJ SOLN: 50.0000 ug | INTRAMUSCULAR | Status: DC | PRN

## 2014-09-02 MED ORDER — PROPOFOL 10 MG/ML IV BOLUS
INTRAVENOUS | Status: AC
  Filled 2014-09-02: qty 20

## 2014-09-02 MED ORDER — LIDOCAINE HCL (CARDIAC) 20 MG/ML IV SOLN
INTRAVENOUS | Status: DC | PRN
  Administered 2014-09-02: 80 mg via INTRAVENOUS

## 2014-09-02 SURGICAL SUPPLY — 64 items
APL SKNCLS STERI-STRIP NONHPOA (GAUZE/BANDAGES/DRESSINGS)
BANDAGE ELASTIC 3 VELCRO ST LF (GAUZE/BANDAGES/DRESSINGS) ×1 IMPLANT
BANDAGE ELASTIC 4 VELCRO ST LF (GAUZE/BANDAGES/DRESSINGS) IMPLANT
BENZOIN TINCTURE PRP APPL 2/3 (GAUZE/BANDAGES/DRESSINGS) IMPLANT
BLADE SURG 15 STRL LF DISP TIS (BLADE) ×1 IMPLANT
BLADE SURG 15 STRL SS (BLADE) ×2
BNDG CMPR 9X4 STRL LF SNTH (GAUZE/BANDAGES/DRESSINGS) ×1
BNDG CMPR MD 5X2 ELC HKLP STRL (GAUZE/BANDAGES/DRESSINGS)
BNDG COHESIVE 1X5 TAN STRL LF (GAUZE/BANDAGES/DRESSINGS) ×1 IMPLANT
BNDG ELASTIC 2 VLCR STRL LF (GAUZE/BANDAGES/DRESSINGS) IMPLANT
BNDG ESMARK 4X9 LF (GAUZE/BANDAGES/DRESSINGS) ×1 IMPLANT
BNDG GAUZE ELAST 4 BULKY (GAUZE/BANDAGES/DRESSINGS) IMPLANT
CANISTER SUCT 1200ML W/VALVE (MISCELLANEOUS) IMPLANT
CORDS BIPOLAR (ELECTRODE) IMPLANT
COVER BACK TABLE 60X90IN (DRAPES) ×2 IMPLANT
CUFF TOURNIQUET SINGLE 18IN (TOURNIQUET CUFF) ×1 IMPLANT
DECANTER SPIKE VIAL GLASS SM (MISCELLANEOUS) IMPLANT
DRAPE EXTREMITY T 121X128X90 (DRAPE) ×2 IMPLANT
DRAPE OEC MINIVIEW 54X84 (DRAPES) ×2 IMPLANT
DRAPE SURG 17X23 STRL (DRAPES) ×2 IMPLANT
DURAPREP 26ML APPLICATOR (WOUND CARE) ×2 IMPLANT
GAUZE SPONGE 4X4 12PLY STRL (GAUZE/BANDAGES/DRESSINGS) ×2 IMPLANT
GAUZE SPONGE 4X4 16PLY XRAY LF (GAUZE/BANDAGES/DRESSINGS) IMPLANT
GAUZE XEROFORM 1X8 LF (GAUZE/BANDAGES/DRESSINGS) ×1 IMPLANT
GLOVE BIOGEL M STRL SZ7.5 (GLOVE) ×1 IMPLANT
GLOVE BIOGEL PI IND STRL 8 (GLOVE) IMPLANT
GLOVE BIOGEL PI INDICATOR 8 (GLOVE) ×1
GLOVE SURG SYN 8.0 (GLOVE) ×2 IMPLANT
GLOVE SURG SYN 8.0 PF PI (GLOVE) ×2 IMPLANT
GOWN STRL REUS W/ TWL LRG LVL3 (GOWN DISPOSABLE) ×1 IMPLANT
GOWN STRL REUS W/ TWL XL LVL3 (GOWN DISPOSABLE) IMPLANT
GOWN STRL REUS W/TWL LRG LVL3 (GOWN DISPOSABLE)
GOWN STRL REUS W/TWL XL LVL3 (GOWN DISPOSABLE) ×6 IMPLANT
NDL HYPO 25X1 1.5 SAFETY (NEEDLE) IMPLANT
NEEDLE HYPO 25X1 1.5 SAFETY (NEEDLE) ×2 IMPLANT
NS IRRIG 1000ML POUR BTL (IV SOLUTION) IMPLANT
PACK BASIN DAY SURGERY FS (CUSTOM PROCEDURE TRAY) ×2 IMPLANT
PAD CAST 3X4 CTTN HI CHSV (CAST SUPPLIES) ×1 IMPLANT
PAD CAST 4YDX4 CTTN HI CHSV (CAST SUPPLIES) IMPLANT
PADDING CAST ABS 4INX4YD NS (CAST SUPPLIES)
PADDING CAST ABS COTTON 4X4 ST (CAST SUPPLIES) ×1 IMPLANT
PADDING CAST COTTON 3X4 STRL (CAST SUPPLIES)
PADDING CAST COTTON 4X4 STRL (CAST SUPPLIES)
PADDING UNDERCAST 2 STRL (CAST SUPPLIES)
PADDING UNDERCAST 2X4 STRL (CAST SUPPLIES) ×1 IMPLANT
SHEET MEDIUM DRAPE 40X70 STRL (DRAPES) ×2 IMPLANT
SPLINT FINGER 2.25 911902 (SOFTGOODS) ×1 IMPLANT
SPLINT PLASTER CAST XFAST 4X15 (CAST SUPPLIES) IMPLANT
SPLINT PLASTER XTRA FAST SET 4 (CAST SUPPLIES)
STOCKINETTE 4X48 STRL (DRAPES) ×2 IMPLANT
STRIP CLOSURE SKIN 1/2X4 (GAUZE/BANDAGES/DRESSINGS) IMPLANT
SUCTION FRAZIER TIP 10 FR DISP (SUCTIONS) IMPLANT
SUT ETHILON 4 0 PS 2 18 (SUTURE) IMPLANT
SUT ETHILON 5 0 PS 2 18 (SUTURE) IMPLANT
SUT MERSILENE 4 0 P 3 (SUTURE) IMPLANT
SUT VIC AB 4-0 P-3 18XBRD (SUTURE) IMPLANT
SUT VIC AB 4-0 P3 18 (SUTURE)
SUT VICRYL RAPIDE 4-0 (SUTURE) IMPLANT
SUT VICRYL RAPIDE 4/0 PS 2 (SUTURE) IMPLANT
SYR BULB 3OZ (MISCELLANEOUS) IMPLANT
SYRINGE 10CC LL (SYRINGE) ×1 IMPLANT
TOWEL OR 17X24 6PK STRL BLUE (TOWEL DISPOSABLE) ×2 IMPLANT
TUBE CONNECTING 20X1/4 (TUBING) IMPLANT
UNDERPAD 30X30 INCONTINENT (UNDERPADS AND DIAPERS) ×2 IMPLANT

## 2014-09-02 NOTE — Discharge Instructions (Signed)

## 2014-09-02 NOTE — Transfer of Care (Signed)
Immediate Anesthesia Transfer of Care Note  Patient: Alexis Rose  Procedure(s) Performed: Procedure(s): CLOSED REDUCTION PERCUTANEOUS PINNING RIGHT INDEX P-2 FRACTURE  (Right)  Patient Location: PACU  Anesthesia Type:General  Level of Consciousness: sedated  Airway & Oxygen Therapy: Patient Spontanous Breathing and Patient connected to face mask oxygen  Post-op Assessment: Report given to RN and Post -op Vital signs reviewed and stable  Post vital signs: Reviewed and stable  Last Vitals:  Filed Vitals:   09/02/14 1113  BP: 123/75  Pulse: 87  Temp: 36.8 C  Resp: 20    Complications: No apparent anesthesia complications

## 2014-09-02 NOTE — Anesthesia Preprocedure Evaluation (Addendum)
Anesthesia Evaluation  Patient identified by MRN, date of birth, ID band Patient awake    Reviewed: Allergy & Precautions, H&P , NPO status , Patient's Chart, lab work & pertinent test results  Airway Mallampati: II  TM Distance: >3 FB Neck ROM: full    Dental  (+) Teeth Intact, Dental Advidsory Given   Pulmonary neg pulmonary ROS, resolved,    Pulmonary exam normal       Cardiovascular negative cardio ROS      Neuro/Psych negative neurological ROS  negative psych ROS   GI/Hepatic negative GI ROS, Neg liver ROS,   Endo/Other  negative endocrine ROS  Renal/GU negative Renal ROS     Musculoskeletal   Abdominal Normal abdominal exam  (+)   Peds  Hematology   Anesthesia Other Findings   Reproductive/Obstetrics negative OB ROS                            Anesthesia Physical Anesthesia Plan  ASA: I  Anesthesia Plan: General LMA   Post-op Pain Management:    Induction:   Airway Management Planned:   Additional Equipment:   Intra-op Plan:   Post-operative Plan:   Informed Consent: I have reviewed the patients History and Physical, chart, labs and discussed the procedure including the risks, benefits and alternatives for the proposed anesthesia with the patient or authorized representative who has indicated his/her understanding and acceptance.   Dental Advisory Given  Plan Discussed with: Anesthesiologist, Surgeon and CRNA  Anesthesia Plan Comments:        Anesthesia Quick Evaluation

## 2014-09-02 NOTE — H&P (Signed)
Alexis Rose is an 49 y.o. female.   Chief Complaint: right index finger pain and deformity HPI: as above with right hand trauma  Past Medical History  Diagnosis Date  . Allergy   . Wears glasses     reading    Past Surgical History  Procedure Laterality Date  . Cesarean section      Family History  Problem Relation Age of Onset  . High Cholesterol Mother    Social History:  reports that she has never smoked. She does not have any smokeless tobacco history on file. She reports that she does not drink alcohol or use illicit drugs.  Allergies: No Known Allergies  No prescriptions prior to admission    No results found for this or any previous visit (from the past 48 hour(s)). No results found.  Review of Systems  All other systems reviewed and are negative.   Height 5\' 5"  (1.651 m), weight 51.71 kg (114 lb), last menstrual period 08/28/2014. Physical Exam  Constitutional: She is oriented to person, place, and time. She appears well-developed and well-nourished.  HENT:  Head: Normocephalic and atraumatic.  Cardiovascular: Normal rate.   Respiratory: Effort normal.  Musculoskeletal:       Right hand: She exhibits bony tenderness, deformity and swelling.  Displaced right index middle phalanx fracture  Neurological: She is alert and oriented to person, place, and time.  Skin: Skin is warm.  Psychiatric: She has a normal mood and affect. Her behavior is normal. Judgment and thought content normal.     Assessment/Plan As above   Plan CRPP  Emonte Dieujuste A 09/02/2014, 10:55 AM

## 2014-09-02 NOTE — Anesthesia Postprocedure Evaluation (Signed)
  Anesthesia Post-op Note  Patient: Alexis Rose  Procedure(s) Performed: Procedure(s): CLOSED REDUCTION PERCUTANEOUS PINNING RIGHT INDEX P-2 FRACTURE  (Right)  Patient Location: PACU  Anesthesia Type: General LMA   Level of Consciousness: awake, alert  and oriented  Airway and Oxygen Therapy: Patient Spontanous Breathing  Post-op Pain: mild  Post-op Assessment: Post-op Vital signs reviewed  Post-op Vital Signs: Reviewed  Last Vitals:  Filed Vitals:   09/02/14 1245  BP:   Pulse: 63  Temp:   Resp: 10    Complications: No apparent anesthesia complications

## 2014-09-02 NOTE — Op Note (Signed)
See note [phone]

## 2014-09-02 NOTE — Anesthesia Procedure Notes (Signed)
Procedure Name: LMA Insertion Date/Time: 09/02/2014 11:42 AM Performed by: Maryella Shivers Pre-anesthesia Checklist: Patient identified, Emergency Drugs available, Suction available and Patient being monitored Patient Re-evaluated:Patient Re-evaluated prior to inductionOxygen Delivery Method: Circle System Utilized Preoxygenation: Pre-oxygenation with 100% oxygen Intubation Type: IV induction Ventilation: Mask ventilation without difficulty LMA: LMA inserted LMA Size: 4.0 Number of attempts: 1 Airway Equipment and Method: Bite block Placement Confirmation: positive ETCO2 Tube secured with: Tape Dental Injury: Teeth and Oropharynx as per pre-operative assessment

## 2014-09-03 ENCOUNTER — Encounter (HOSPITAL_BASED_OUTPATIENT_CLINIC_OR_DEPARTMENT_OTHER): Payer: Self-pay | Admitting: Orthopedic Surgery

## 2014-09-03 NOTE — Op Note (Signed)
Alexis Rose, REKOWSKI                 ACCOUNT NO.:  [id number]  MEDICAL RECORD NO.:  [id number]  LOCATION:                                 FACILITY:  PHYSICIAN:  Sheral Apley. Shella Lahman, M.D.DATE OF BIRTH:  10-Aug-1965  DATE OF PROCEDURE:  09/02/2014 DATE OF DISCHARGE:  09/02/2014                              OPERATIVE REPORT   PREOPERATIVE DIAGNOSIS:  Displaced right index finger middle phalangeal fracture.  POSTOPERATIVE DIAGNOSIS:  Displaced right index finger middle phalangeal fracture.  PROCEDURE:  Closed reduction, percutaneous pinning above.  SURGEON:  Sheral Apley. Burney Gauze, MD  ASSISTANT:  None.  ANESTHESIA:  General.  TOURNIQUET TIME:  10 minutes.  COMPLICATIONS:  No complication.  DRAINS:  No drains.  DESCRIPTION OF PROCEDURE:  The patient was taken to the operating suite. After induction of adequate general anesthetic, right upper extremity was prepped and draped in sterile fashion.  An Esmarch was used to exsanguinate the limb.  Tourniquet was inflated to 250 mmHg.  At this point in time, a longitudinal traction was placed to the index finger. Under fluoroscopic imaging, I passed two 0.35 K-wires from distal to proximal in cross fashion across the displaced volarly and middle phalangeal fracture.  Intraoperative fluoroscopy revealed adequate reduction in AP, lateral, and oblique view.  The K-wires were cut out of the skin and bent upon themselves.  Dressed with Xeroform, 4x4s, and a volar splint.  The patient tolerated the procedure well and went to recovery room in stable fashion.     Sheral Apley Burney Gauze, M.D.     MAW/MEDQ  D:  09/02/2014  T:  09/03/2014  Job:  [id number]

## 2015-02-15 ENCOUNTER — Other Ambulatory Visit: Payer: Self-pay

## 2015-02-15 DIAGNOSIS — Z1231 Encounter for screening mammogram for malignant neoplasm of breast: Secondary | ICD-10-CM

## 2015-02-17 ENCOUNTER — Ambulatory Visit
Admission: RE | Admit: 2015-02-17 | Discharge: 2015-02-17 | Disposition: A | Discharge status: Home / Self Care | Payer: 59 | Source: Ambulatory Visit

## 2015-02-17 DIAGNOSIS — Z1231 Encounter for screening mammogram for malignant neoplasm of breast: Secondary | ICD-10-CM

## 2015-08-05 ENCOUNTER — Other Ambulatory Visit: Payer: Self-pay

## 2015-08-05 ENCOUNTER — Other Ambulatory Visit: Payer: Self-pay | Admitting: Family Medicine

## 2015-08-05 ENCOUNTER — Other Ambulatory Visit: Payer: Self-pay | Admitting: Obstetrics and Gynecology

## 2015-08-05 DIAGNOSIS — N644 Mastodynia: Secondary | ICD-10-CM

## 2015-08-31 ENCOUNTER — Other Ambulatory Visit: Payer: Self-pay | Admitting: Family Medicine

## 2015-08-31 DIAGNOSIS — R221 Localized swelling, mass and lump, neck: Secondary | ICD-10-CM

## 2015-09-02 ENCOUNTER — Ambulatory Visit
Admission: RE | Admit: 2015-09-02 | Discharge: 2015-09-02 | Disposition: A | Discharge status: Home / Self Care | Payer: BLUE CROSS/BLUE SHIELD | Source: Ambulatory Visit | Attending: Family Medicine | Admitting: Family Medicine

## 2015-09-02 DIAGNOSIS — R221 Localized swelling, mass and lump, neck: Secondary | ICD-10-CM

## 2015-09-02 MED ORDER — IOPAMIDOL (ISOVUE-300) INJECTION 61%
75.0000 mL | Freq: Once | INTRAVENOUS | Status: AC | PRN
  Administered 2015-09-02: 75 mL via INTRAVENOUS

## 2015-09-07 ENCOUNTER — Other Ambulatory Visit: Payer: Self-pay | Admitting: Family Medicine

## 2015-09-07 DIAGNOSIS — E041 Nontoxic single thyroid nodule: Secondary | ICD-10-CM

## 2015-09-08 ENCOUNTER — Ambulatory Visit
Admission: RE | Admit: 2015-09-08 | Discharge: 2015-09-08 | Disposition: A | Discharge status: Home / Self Care | Payer: BLUE CROSS/BLUE SHIELD | Source: Ambulatory Visit | Attending: Family Medicine | Admitting: Family Medicine

## 2015-09-08 DIAGNOSIS — E041 Nontoxic single thyroid nodule: Secondary | ICD-10-CM

## 2015-09-09 ENCOUNTER — Other Ambulatory Visit: Payer: Self-pay | Admitting: Family Medicine

## 2015-09-09 DIAGNOSIS — E041 Nontoxic single thyroid nodule: Secondary | ICD-10-CM

## 2015-09-15 ENCOUNTER — Ambulatory Visit
Admission: RE | Admit: 2015-09-15 | Discharge: 2015-09-15 | Disposition: A | Discharge status: Home / Self Care | Payer: BLUE CROSS/BLUE SHIELD | Source: Ambulatory Visit | Attending: Family Medicine | Admitting: Family Medicine

## 2015-09-15 ENCOUNTER — Other Ambulatory Visit (HOSPITAL_COMMUNITY)
Admission: RE | Admit: 2015-09-15 | Discharge: 2015-09-15 | Disposition: A | Discharge status: Home / Self Care | Payer: BLUE CROSS/BLUE SHIELD | Source: Ambulatory Visit | Attending: Radiology | Admitting: Radiology

## 2015-09-15 DIAGNOSIS — E041 Nontoxic single thyroid nodule: Secondary | ICD-10-CM | POA: Insufficient documentation

## 2015-09-21 ENCOUNTER — Ambulatory Visit: Payer: Self-pay | Admitting: General Surgery

## 2015-09-21 NOTE — H&P (Signed)
History of Present Illness Alexis Ok MD; 09/21/2015 2:11 PM) The patient is a 50 year old female who presents with a thyroid nodule. The patient is a 40 -year-old female who is referred by Dr. Harlan Stains for evaluation of a right thyroid nodule. Patient underwent ultrasound and CT scan which revealed a 2 cm right thyroid nodule. This underwent FNA biopsy.  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 SEE COMMENT. COMMENT: THE SPECIMEN IS HYPERCELLULAR AND CONSISTS OF SMALL AND LARGE SIZED GROUPS OF FOLLICULAR EPITHELIAL CELLS WITH HURTHLE CELL FEATURES. THERE IS MINIMAL BACKGROUND COLLOID. BASED ON THESE FINDINGS, A HURTHLE CELL LESION/NEOPLASM CAN NOT BE RULED OUT.   Other Problems Alexis Rose, CMA; 09/21/2015 1:43 PM) Gastroesophageal Reflux Disease Thyroid Disease  Past Surgical History Alexis Rose, CMA; 09/21/2015 1:43 PM) No pertinent past surgical history  Diagnostic Studies History Alexis Rose, CMA; 09/21/2015 1:43 PM) Colonoscopy 1-5 years ago Mammogram 1-3 years ago Pap Smear 1-5 years ago  Allergies Alexis Rose, CMA; 09/21/2015 1:43 PM) No Known Drug Allergies03/28/2017  Medication History Alexis Rose, CMA; 09/21/2015 1:43 PM) No Current Medications Medications Reconciled  Social History Alexis Rose, CMA; 09/21/2015 1:43 PM) No alcohol use No caffeine use No drug use Tobacco use Never smoker.  Family History Alexis Rose, [place]; 09/21/2015 1:43 PM) Hypertension Mother. Thyroid problems Sister.  Pregnancy / Birth History Alexis Rose, CMA; 09/21/2015 1:43 PM) Age at menarche 59 years. Gravida 4 Irregular periods Maternal age 74-30 Para 2    Review of Systems Alexis Rose CMA; 09/21/2015 1:43 PM) General Present- Chills. Not Present- Appetite Loss, Fatigue, Fever, Night Sweats,  Weight Gain and Weight Loss. HEENT Present- Seasonal Allergies and Wears glasses/contact lenses. Not Present- Earache, Hearing Loss, Hoarseness, Nose Bleed, Oral Ulcers, Ringing in the Ears, Sinus Pain, Sore Throat, Visual Disturbances and Yellow Eyes. Gastrointestinal Present- Bloody Stool, Hemorrhoids and Indigestion. Not Present- Abdominal Pain, Bloating, Change in Bowel Habits, Chronic diarrhea, Constipation, Difficulty Swallowing, Excessive gas, Gets full quickly at meals, Nausea, Rectal Pain and Vomiting. Female Genitourinary Present- Pelvic Pain. Not Present- Frequency, Nocturia, Painful Urination and Urgency. Endocrine Present- Hot flashes. Not Present- Cold Intolerance, Excessive Hunger, Hair Changes, Heat Intolerance and New Diabetes.  Vitals Alexis Rose CMA; 09/21/2015 1:44 PM) 09/21/2015 1:43 PM Weight: 106.6 lb Height: 64in Body Surface Area: 1.5 m Body Mass Index: 18.3 kg/m  Temp.: 97.76F  Pulse: 87 (Regular)  BP: 116/78 (Sitting, Left Arm, Standard)       Physical Exam Alexis Ok MD; 09/21/2015 2:12 PM) General Mental Status-Alert. General Appearance-Consistent with stated age. Hydration-Well hydrated. Voice-Normal.  Head and Neck Thyroid Gland Characteristics - Note: Multiple right inferior nodule.  Chest and Lung Exam Chest and lung exam reveals -quiet, even and easy respiratory effort with no use of accessory muscles and on auscultation, normal breath sounds, no adventitious sounds and normal vocal resonance. Inspection Chest Wall - Normal. Back - normal.  Cardiovascular Cardiovascular examination reveals -normal heart sounds, regular rate and rhythm with no murmurs and normal pedal pulses bilaterally.  Abdomen Inspection Inspection of the abdomen reveals - No Hernias. Skin - Scar - no surgical scars. Palpation/Percussion Palpation and Percussion of the abdomen reveal - Soft, Non Tender, No Rebound tenderness, No Rigidity  (guarding) and No hepatosplenomegaly. Auscultation Auscultation of the abdomen reveals - Bowel sounds normal.    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 Will proceed Scarpetta for right thyroid lobectomy 2. All risks and benefits were discussed with the patient to generally include: infection, bleeding, damage to the recurrent laryngeal nerve, damage to parathyroid glands, and possible need for further surgery. Alternatives were offered and described. All questions were answered and the patient voiced understanding of the procedure and wishes to proceed.

## 2015-10-15 ENCOUNTER — Ambulatory Visit (HOSPITAL_COMMUNITY)
Admission: RE | Admit: 2015-10-15 | Discharge: 2015-10-15 | Disposition: A | Discharge status: Home / Self Care | Payer: BLUE CROSS/BLUE SHIELD | Source: Ambulatory Visit | Attending: Anesthesiology | Admitting: Anesthesiology

## 2015-10-15 ENCOUNTER — Encounter (HOSPITAL_COMMUNITY)
Admission: RE | Admit: 2015-10-15 | Discharge: 2015-10-15 | Disposition: A | Discharge status: Home / Self Care | Payer: BLUE CROSS/BLUE SHIELD | Source: Ambulatory Visit | Attending: General Surgery | Admitting: General Surgery

## 2015-10-15 ENCOUNTER — Encounter (HOSPITAL_COMMUNITY): Payer: Self-pay

## 2015-10-15 DIAGNOSIS — R918 Other nonspecific abnormal finding of lung field: Secondary | ICD-10-CM | POA: Diagnosis not present

## 2015-10-15 DIAGNOSIS — Z01818 Encounter for other preprocedural examination: Secondary | ICD-10-CM | POA: Insufficient documentation

## 2015-10-15 DIAGNOSIS — Z01811 Encounter for preprocedural respiratory examination: Secondary | ICD-10-CM

## 2015-10-15 HISTORY — DX: Pneumonia, unspecified organism: J18.9

## 2015-10-15 HISTORY — DX: Headache, unspecified: R51.9

## 2015-10-15 HISTORY — DX: Gastro-esophageal reflux disease without esophagitis: K21.9

## 2015-10-15 HISTORY — DX: Headache: R51

## 2015-10-15 HISTORY — DX: Anxiety disorder, unspecified: F41.9

## 2015-10-15 LAB — CBC
HCT: 41 % (ref 36.0–46.0)
Hemoglobin: 13.9 g/dL (ref 12.0–15.0)
MCH: 29.6 pg (ref 26.0–34.0)
MCHC: 33.9 g/dL (ref 30.0–36.0)
MCV: 87.4 fL (ref 78.0–100.0)
Platelets: 226 10*3/uL (ref 150–400)
RBC: 4.69 MIL/uL (ref 3.87–5.11)
RDW: 13.3 % (ref 11.5–15.5)
WBC: 5.6 10*3/uL (ref 4.0–10.5)

## 2015-10-15 LAB — BASIC METABOLIC PANEL
Anion gap: 10 (ref 5–15)
BUN: 7 mg/dL (ref 6–20)
CO2: 20 mmol/L — ABNORMAL LOW (ref 22–32)
Calcium: 9.4 mg/dL (ref 8.9–10.3)
Chloride: 109 mmol/L (ref 101–111)
Creatinine, Ser: 0.72 mg/dL (ref 0.44–1.00)
GFR calc Af Amer: >60 mL/min (ref >60)
GFR calc non Af Amer: >60 mL/min (ref >60)
Glucose, Bld: 86 mg/dL (ref 65–99)
Potassium: 3.7 mmol/L (ref 3.5–5.1)
Sodium: 139 mmol/L (ref 135–145)

## 2015-10-15 LAB — HCG, SERUM, QUALITATIVE: Preg, Serum: NEGATIVE

## 2015-10-15 NOTE — Progress Notes (Signed)
PCP is Dr Harlan Stains Denies ever seeing a cardiologist. Denies ever having a card cath, stress test, or echo.  Interpreter today is Paramedic.  Interpreter has been requested for the day of surgery

## 2015-10-15 NOTE — Pre-Procedure Instructions (Signed)
Alexis Rose  10/15/2015      [facility] [address], [address] [place]. [place] [address] Phone: [phone] Fax: [phone]    Your procedure is scheduled on April 28  Report to [place] at 1115 A.M.  Call this number if you have problems the morning of surgery:  [phone]   Remember:  Do not eat food or drink liquids after midnight.  Take these medicines the morning of surgery with A SIP OF WATER Falmina, Fexofenadine(Allegra), Fluticasone (Flonase) nasal Spray  Stop taking aspirin, BC's, Goody's, Herbal medications, Fish Oil, Aleve, Ibuprofen, Advil, Motrin   Do not wear jewelry, make-up or nail polish.  Do not wear lotions, powders, or perfumes.  You may wear deodorant.  Do not shave 48 hours prior to surgery.  Men may shave face and neck.  Do not bring valuables to the hospital.  [facility] is not responsible for any belongings or valuables.  Contacts, dentures or bridgework may not be worn into surgery.  Leave your suitcase in the car.  After surgery it may be brought to your room.  For patients admitted to the hospital, discharge time will be determined by your treatment team.  Patients discharged the day of surgery will not be allowed to drive home.   Special instructions:  [facility] - Preparing for Surgery  Before surgery, you can play an important role.  Because skin is not sterile, your skin needs to be as free of germs as possible.  You can reduce the number of germs on you skin by washing with CHG (chlorahexidine gluconate) soap before surgery.  CHG is an antiseptic cleaner which kills germs and bonds with the skin to continue killing germs even after washing.  Please DO NOT use if you have an allergy to CHG or antibacterial soaps.  If your skin becomes reddened/irritated stop using the CHG and inform your nurse when you arrive at Short Stay.  Do not shave (including legs and  underarms) for at least 48 hours prior to the first CHG shower.  You may shave your face.  Please follow these instructions carefully:   1.  Shower with CHG Soap the night before surgery and the morning of Surgery.  2.  If you choose to wash your hair, wash your hair first as usual with your  normal shampoo.  3.  After you shampoo, rinse your hair and body thoroughly to remove the Shampoo.  4.  Use CHG as you would any other liquid soap.  You can apply chg directly  to the skin and wash gently with scrungie or a clean washcloth.  5.  Apply the CHG Soap to your body ONLY FROM THE NECK DOWN.   Do not use on open wounds or open sores.  Avoid contact with your eyes,   ears, mouth and genitals (private parts).  Wash genitals (private parts)  with your normal soap.  6.  Wash thoroughly, paying special attention to the area where your surgery  will be performed.  7.  Thoroughly rinse your body with warm water from the neck down.  8.  DO NOT shower/wash with your normal soap after using and rinsing off  the CHG Soap.  9.  Pat yourself dry with a clean towel.            10.  Wear clean pajamas.            11.  Place clean sheets on your bed the night of your first shower and do not sleep with pets.  Day of Surgery  Do not apply any lotions/deoderants the morning of surgery.  Please wear clean clothes to the hospital/surgery center.     Please read over the following fact sheets that you were given. Pain Booklet, Coughing and Deep Breathing and Surgical Site Infection Prevention

## 2015-10-18 NOTE — Progress Notes (Addendum)
Anesthesia Chart Review: Patient is a 50 year old female scheduled for right thyroid lobectomy on 10/22/15 by Dr. Rosendo Gros. Interpretor requested for the day of surgery.      History includes non-smoker, GERD, anxiety, PNA, closed reduction with percutaneous pinning right index finger '16. Right thyroid nodule with 09/15/15 FNA results showing a hurthle cell lesion/neoplasm cannot be ruled out. There was no reported history of hyperthyroidism. No recent TSH noted in Epic or from her PCP Dr. Harlan Stains (staff message sent to notify Dr. Rosendo Gros; he did not feel that further preoperative labs testing was indicated).  PAT Vitals: BP 134/75, HR 88, RR 18, T 36.5C, O2 sat 100%.  09/08/15 Thyroid U/S: IMPRESSION: 1. Bilateral hypoechoic solid thyroid nodules. 2. The dominant 2.2 cm nodule in the right lower girl and meets consensus criteria for further evaluation. Findings meet consensus criteria for biopsy. Ultrasound-guided fine needle aspiration should be considered, as per the consensus statement: Management of Thyroid Nodules Detected at [place]: Society of Radiologists in [place]. Radiology 2005; [id number].  09/02/15 CT soft tissue neck: IMPRESSION: 1. Palpable abnormality appears to correspond to a 2 cm right thyroid nodule. This meets consensus criteria for follow-up Thyroid Ultrasound characterization (ACR consensus guidelines: Managing Incidental Thyroid Nodules Detected on Imaging: White Paper of the ACR Incidental Thyroid Findings Committee. J Am Coll Radiol 2015; 12:143-150). 2. Otherwise negative neck CT. No cervical lymphadenopathy.  10/15/15 CXR: IMPRESSION: Hyperinflation consistent with COPD or reactive airway disease. There is no evidence of pneumonia nor CHF.  Preoperative labs noted. Serum pregnancy test was negative.   Pre-operative labs and CXR routed to Dr. Dema [place] for review and follow-up as felt indicated. Further evaluation on the day of  surgery by patient's anesthesiologist.  Myra Gianotti, PA-C [facility] Short Stay Center/Anesthesiology Phone [phone] 10/18/2015 3:05 PM

## 2015-10-21 MED ORDER — CEFAZOLIN SODIUM-DEXTROSE 2-4 GM/100ML-% IV SOLN
2.0000 g | INTRAVENOUS | Status: AC
  Administered 2015-10-22: 2 g via INTRAVENOUS
  Filled 2015-10-21: qty 100

## 2015-10-22 ENCOUNTER — Encounter (HOSPITAL_COMMUNITY)
Admission: RE | Disposition: A | Discharge status: Home / Self Care | Payer: Self-pay | Source: Ambulatory Visit | Attending: General Surgery

## 2015-10-22 ENCOUNTER — Encounter (HOSPITAL_COMMUNITY): Payer: Self-pay | Admitting: General Practice

## 2015-10-22 ENCOUNTER — Ambulatory Visit (HOSPITAL_COMMUNITY): Payer: BLUE CROSS/BLUE SHIELD | Admitting: Certified Registered"

## 2015-10-22 ENCOUNTER — Observation Stay (HOSPITAL_COMMUNITY)
Admission: RE | Admit: 2015-10-22 | Discharge: 2015-10-23 | Disposition: A | Discharge status: Home / Self Care | Payer: BLUE CROSS/BLUE SHIELD | Source: Ambulatory Visit | Attending: General Surgery | Admitting: General Surgery

## 2015-10-22 ENCOUNTER — Ambulatory Visit (HOSPITAL_COMMUNITY): Payer: BLUE CROSS/BLUE SHIELD | Admitting: Vascular Surgery

## 2015-10-22 DIAGNOSIS — D34 Benign neoplasm of thyroid gland: Principal | ICD-10-CM | POA: Insufficient documentation

## 2015-10-22 DIAGNOSIS — K219 Gastro-esophageal reflux disease without esophagitis: Secondary | ICD-10-CM | POA: Diagnosis not present

## 2015-10-22 DIAGNOSIS — Z9889 Other specified postprocedural states: Secondary | ICD-10-CM

## 2015-10-22 DIAGNOSIS — E890 Postprocedural hypothyroidism: Secondary | ICD-10-CM

## 2015-10-22 DIAGNOSIS — E041 Nontoxic single thyroid nodule: Secondary | ICD-10-CM | POA: Diagnosis present

## 2015-10-22 HISTORY — PX: THYROID LOBECTOMY: SHX420

## 2015-10-22 SURGERY — LOBECTOMY, THYROID
Anesthesia: General | Site: Neck | Laterality: Right

## 2015-10-22 MED ORDER — 0.9 % SODIUM CHLORIDE (POUR BTL) OPTIME
TOPICAL | Status: DC | PRN
  Administered 2015-10-22: 1000 mL

## 2015-10-22 MED ORDER — LEVONORGESTREL-ETHINYL ESTRAD 0.1-20 MG-MCG PO TABS: 1.0000 | ORAL_TABLET | Freq: Every day | ORAL | Status: DC

## 2015-10-22 MED ORDER — DEXAMETHASONE SODIUM PHOSPHATE 10 MG/ML IJ SOLN
INTRAMUSCULAR | Status: DC | PRN
  Administered 2015-10-22: 10 mg via INTRAVENOUS

## 2015-10-22 MED ORDER — PROPOFOL 10 MG/ML IV BOLUS
INTRAVENOUS | Status: AC
  Filled 2015-10-22: qty 20

## 2015-10-22 MED ORDER — FENTANYL CITRATE (PF) 100 MCG/2ML IJ SOLN
INTRAMUSCULAR | Status: AC
  Filled 2015-10-22: qty 2

## 2015-10-22 MED ORDER — DEXAMETHASONE SODIUM PHOSPHATE 10 MG/ML IJ SOLN
INTRAMUSCULAR | Status: AC
  Filled 2015-10-22: qty 1

## 2015-10-22 MED ORDER — PHENOL 1.4 % MT LIQD: 1.0000 | OROMUCOSAL | Status: DC | PRN

## 2015-10-22 MED ORDER — ONDANSETRON HCL 4 MG/2ML IJ SOLN
INTRAMUSCULAR | Status: AC
Start: 2015-10-22 — End: 2015-10-22
  Filled 2015-10-22: qty 2

## 2015-10-22 MED ORDER — MIDAZOLAM HCL 2 MG/2ML IJ SOLN
INTRAMUSCULAR | Status: AC
Start: 2015-10-22 — End: 2015-10-22
  Filled 2015-10-22: qty 2

## 2015-10-22 MED ORDER — SUGAMMADEX SODIUM 500 MG/5ML IV SOLN
INTRAVENOUS | Status: AC
  Filled 2015-10-22: qty 5

## 2015-10-22 MED ORDER — MEPERIDINE HCL 25 MG/ML IJ SOLN: 6.2500 mg | INTRAMUSCULAR | Status: DC | PRN

## 2015-10-22 MED ORDER — ONDANSETRON 4 MG PO TBDP: 4.0000 mg | ORAL_TABLET | Freq: Four times a day (QID) | ORAL | Status: DC | PRN

## 2015-10-22 MED ORDER — ONDANSETRON HCL 4 MG/2ML IJ SOLN
INTRAMUSCULAR | Status: AC
  Filled 2015-10-22: qty 2

## 2015-10-22 MED ORDER — CHLORHEXIDINE GLUCONATE 4 % EX LIQD: 1.0000 "application " | Freq: Once | CUTANEOUS | Status: DC

## 2015-10-22 MED ORDER — LACTATED RINGERS IV SOLN: INTRAVENOUS | Status: DC

## 2015-10-22 MED ORDER — OXYCODONE HCL 5 MG PO TABS
ORAL_TABLET | ORAL | Status: AC
  Administered 2015-10-22: 10 mg via ORAL
  Filled 2015-10-22: qty 2

## 2015-10-22 MED ORDER — ONDANSETRON HCL 4 MG/2ML IJ SOLN
INTRAMUSCULAR | Status: DC | PRN
  Administered 2015-10-22: 4 mg via INTRAVENOUS

## 2015-10-22 MED ORDER — MIDAZOLAM HCL 5 MG/5ML IJ SOLN
INTRAMUSCULAR | Status: DC | PRN
  Administered 2015-10-22: 2 mg via INTRAVENOUS

## 2015-10-22 MED ORDER — DEXTROSE-NACL 5-0.9 % IV SOLN
INTRAVENOUS | Status: DC
  Administered 2015-10-22: 15:00:00 via INTRAVENOUS

## 2015-10-22 MED ORDER — ROCURONIUM BROMIDE 50 MG/5ML IV SOLN
INTRAVENOUS | Status: AC
  Filled 2015-10-22: qty 2

## 2015-10-22 MED ORDER — FENTANYL CITRATE (PF) 250 MCG/5ML IJ SOLN
INTRAMUSCULAR | Status: AC
  Filled 2015-10-22: qty 5

## 2015-10-22 MED ORDER — ROCURONIUM BROMIDE 100 MG/10ML IV SOLN
INTRAVENOUS | Status: DC | PRN
  Administered 2015-10-22: 50 mg via INTRAVENOUS

## 2015-10-22 MED ORDER — SUGAMMADEX SODIUM 200 MG/2ML IV SOLN
INTRAVENOUS | Status: AC
  Filled 2015-10-22: qty 2

## 2015-10-22 MED ORDER — LIDOCAINE 2% (20 MG/ML) 5 ML SYRINGE
INTRAMUSCULAR | Status: AC
  Filled 2015-10-22: qty 20

## 2015-10-22 MED ORDER — HEMOSTATIC AGENTS (NO CHARGE) OPTIME
TOPICAL | Status: DC | PRN
  Administered 2015-10-22: 1

## 2015-10-22 MED ORDER — METOCLOPRAMIDE HCL 5 MG/ML IJ SOLN: 10.0000 mg | Freq: Once | INTRAMUSCULAR | Status: DC | PRN

## 2015-10-22 MED ORDER — FENTANYL CITRATE (PF) 100 MCG/2ML IJ SOLN
INTRAMUSCULAR | Status: DC | PRN
  Administered 2015-10-22: 100 ug via INTRAVENOUS

## 2015-10-22 MED ORDER — PROPOFOL 10 MG/ML IV BOLUS
INTRAVENOUS | Status: DC | PRN
  Administered 2015-10-22: 180 mg via INTRAVENOUS

## 2015-10-22 MED ORDER — OXYCODONE-ACETAMINOPHEN 5-325 MG PO TABS: 1.0000 | ORAL_TABLET | ORAL | Status: AC | PRN

## 2015-10-22 MED ORDER — OXYCODONE HCL 5 MG PO TABS
5.0000 mg | ORAL_TABLET | ORAL | Status: DC | PRN
  Administered 2015-10-22 (×2): 10 mg via ORAL
  Administered 2015-10-23: 5 mg via ORAL
  Filled 2015-10-22: qty 2
  Filled 2015-10-22: qty 1

## 2015-10-22 MED ORDER — SUGAMMADEX SODIUM 200 MG/2ML IV SOLN
INTRAVENOUS | Status: DC | PRN
  Administered 2015-10-22: 200 mg via INTRAVENOUS

## 2015-10-22 MED ORDER — FENTANYL CITRATE (PF) 100 MCG/2ML IJ SOLN
25.0000 ug | INTRAMUSCULAR | Status: DC | PRN
  Administered 2015-10-22: 50 ug via INTRAVENOUS

## 2015-10-22 MED ORDER — BUPIVACAINE HCL (PF) 0.25 % IJ SOLN
INTRAMUSCULAR | Status: AC
  Filled 2015-10-22: qty 30

## 2015-10-22 MED ORDER — LIDOCAINE HCL (CARDIAC) 20 MG/ML IV SOLN
INTRAVENOUS | Status: DC | PRN
  Administered 2015-10-22: 100 mg via INTRAVENOUS

## 2015-10-22 MED ORDER — ONDANSETRON HCL 4 MG/2ML IJ SOLN
INTRAMUSCULAR | Status: AC
  Filled 2015-10-22: qty 4

## 2015-10-22 MED ORDER — LACTATED RINGERS IV SOLN
INTRAVENOUS | Status: DC
  Administered 2015-10-22 (×2): via INTRAVENOUS

## 2015-10-22 MED ORDER — BUPIVACAINE HCL 0.25 % IJ SOLN
INTRAMUSCULAR | Status: DC | PRN
  Administered 2015-10-22: 10 mL

## 2015-10-22 MED ORDER — ONDANSETRON HCL 4 MG/2ML IJ SOLN: 4.0000 mg | Freq: Four times a day (QID) | INTRAMUSCULAR | Status: DC | PRN

## 2015-10-22 SURGICAL SUPPLY — 52 items
ATTRACTOMAT 16X20 MAGNETIC DRP (DRAPES) ×2 IMPLANT
BLADE SURG 15 STRL LF DISP TIS (BLADE) ×1 IMPLANT
BLADE SURG 15 STRL SS (BLADE) ×2
CANISTER SUCTION 2500CC (MISCELLANEOUS) ×2 IMPLANT
CHLORAPREP W/TINT 26ML (MISCELLANEOUS) ×2 IMPLANT
CLIP TI MEDIUM 24 (CLIP) ×2 IMPLANT
CLIP TI WIDE RED SMALL 24 (CLIP) ×2 IMPLANT
CONT SPEC 4OZ CLIKSEAL STRL BL (MISCELLANEOUS) ×2 IMPLANT
COVER SURGICAL LIGHT HANDLE (MISCELLANEOUS) ×2 IMPLANT
CRADLE DONUT ADULT HEAD (MISCELLANEOUS) ×2 IMPLANT
DRAPE LAPAROTOMY 100X72 PEDS (DRAPES) ×1 IMPLANT
DRAPE LAPAROTOMY T 98X78 PEDS (DRAPES) ×2 IMPLANT
DRAPE UTILITY XL STRL (DRAPES) ×4 IMPLANT
ELECT COATED BLADE 2.86 ST (ELECTRODE) ×2 IMPLANT
ELECT REM PT RETURN 9FT ADLT (ELECTROSURGICAL) ×2
ELECTRODE REM PT RTRN 9FT ADLT (ELECTROSURGICAL) ×1 IMPLANT
GAUZE SPONGE 4X4 12PLY STRL (GAUZE/BANDAGES/DRESSINGS) ×2 IMPLANT
GAUZE SPONGE 4X4 16PLY XRAY LF (GAUZE/BANDAGES/DRESSINGS) ×2 IMPLANT
GLOVE BIO SURGEON STRL SZ 6.5 (GLOVE) ×1 IMPLANT
GLOVE BIO SURGEON STRL SZ7 (GLOVE) ×1 IMPLANT
GLOVE BIO SURGEON STRL SZ7.5 (GLOVE) ×2 IMPLANT
GLOVE BIOGEL PI IND STRL 7.0 (GLOVE) IMPLANT
GLOVE BIOGEL PI INDICATOR 7.0 (GLOVE) ×1
GOWN STRL REUS W/ TWL LRG LVL3 (GOWN DISPOSABLE) ×1 IMPLANT
GOWN STRL REUS W/ TWL XL LVL3 (GOWN DISPOSABLE) ×1 IMPLANT
GOWN STRL REUS W/TWL LRG LVL3 (GOWN DISPOSABLE) ×2
GOWN STRL REUS W/TWL XL LVL3 (GOWN DISPOSABLE) ×2
HEMOSTAT SURGICEL 2X4 FIBR (HEMOSTASIS) ×2 IMPLANT
KIT BASIN OR (CUSTOM PROCEDURE TRAY) ×2 IMPLANT
KIT ROOM TURNOVER OR (KITS) ×2 IMPLANT
LIQUID BAND (GAUZE/BANDAGES/DRESSINGS) ×2 IMPLANT
NDL HYPO 25GX1X1/2 BEV (NEEDLE) ×1 IMPLANT
NEEDLE HYPO 25GX1X1/2 BEV (NEEDLE) ×2 IMPLANT
NS IRRIG 1000ML POUR BTL (IV SOLUTION) ×2 IMPLANT
PACK SURGICAL SETUP 50X90 (CUSTOM PROCEDURE TRAY) ×2 IMPLANT
PAD ARMBOARD 7.5X6 YLW CONV (MISCELLANEOUS) ×2 IMPLANT
PENCIL BUTTON HOLSTER BLD 10FT (ELECTRODE) ×2 IMPLANT
SHEARS HARMONIC 9CM CVD (BLADE) ×2 IMPLANT
SPECIMEN JAR MEDIUM (MISCELLANEOUS) IMPLANT
SPONGE INTESTINAL PEANUT (DISPOSABLE) ×2 IMPLANT
SUT MNCRL AB 4-0 PS2 18 (SUTURE) ×2 IMPLANT
SUT SILK 2 0 (SUTURE) ×2
SUT SILK 2-0 18XBRD TIE 12 (SUTURE) ×1 IMPLANT
SUT SILK 3 0 (SUTURE) ×2
SUT SILK 3-0 18XBRD TIE 12 (SUTURE) ×1 IMPLANT
SUT VIC AB 3-0 SH 18 (SUTURE) ×2 IMPLANT
SUT VIC AB 3-0 SH 8-18 (SUTURE) ×1 IMPLANT
SYR BULB 3OZ (MISCELLANEOUS) ×2 IMPLANT
SYR CONTROL 10ML LL (SYRINGE) ×2 IMPLANT
TOWEL OR 17X24 6PK STRL BLUE (TOWEL DISPOSABLE) ×2 IMPLANT
TOWEL OR 17X26 10 PK STRL BLUE (TOWEL DISPOSABLE) ×2 IMPLANT
TUBE CONNECTING 12X1/4 (SUCTIONS) ×2 IMPLANT

## 2015-10-22 NOTE — H&P (Signed)
History of Present Illness Alexis Ok MD; 09/21/2015 2:11 PM) The patient is a 50 year old female who presents with a thyroid nodule. The patient is a 40 -year-old female who is referred by Dr. Harlan Stains for evaluation of a right thyroid nodule. Patient underwent ultrasound and CT scan which revealed a 2 cm right thyroid nodule. This underwent FNA biopsy.  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 SEE COMMENT. COMMENT: THE SPECIMEN IS HYPERCELLULAR AND CONSISTS OF SMALL AND LARGE SIZED GROUPS OF FOLLICULAR EPITHELIAL CELLS WITH HURTHLE CELL FEATURES. THERE IS MINIMAL BACKGROUND COLLOID. BASED ON THESE FINDINGS, A HURTHLE CELL LESION/NEOPLASM CAN NOT BE RULED OUT.   Other Problems Elbert Ewings, CMA; 09/21/2015 1:43 PM) Gastroesophageal Reflux Disease Thyroid Disease  Past Surgical History Elbert Ewings, CMA; 09/21/2015 1:43 PM) No pertinent past surgical history  Diagnostic Studies History Elbert Ewings, CMA; 09/21/2015 1:43 PM) Colonoscopy 1-5 years ago Mammogram 1-3 years ago Pap Smear 1-5 years ago  Allergies Elbert Ewings, CMA; 09/21/2015 1:43 PM) No Known Drug Allergies03/28/2017  Medication History Elbert Ewings, CMA; 09/21/2015 1:43 PM) No Current Medications Medications Reconciled  Social History Elbert Ewings, CMA; 09/21/2015 1:43 PM) No alcohol use No caffeine use No drug use Tobacco use Never smoker.  Family History Elbert Ewings, [place]; 09/21/2015 1:43 PM) Hypertension Mother. Thyroid problems Sister.  Pregnancy / Birth History Elbert Ewings, CMA; 09/21/2015 1:43 PM) Age at menarche 61 years. Gravida 4 Irregular periods Maternal age 23-30 Para 2    Review of Systems Elbert Ewings CMA; 09/21/2015 1:43 PM) General Present- Chills. Not Present- Appetite Loss, Fatigue, Fever, Night Sweats,  Weight Gain and Weight Loss. HEENT Present- Seasonal Allergies and Wears glasses/contact lenses. Not Present- Earache, Hearing Loss, Hoarseness, Nose Bleed, Oral Ulcers, Ringing in the Ears, Sinus Pain, Sore Throat, Visual Disturbances and Yellow Eyes. Gastrointestinal Present- Bloody Stool, Hemorrhoids and Indigestion. Not Present- Abdominal Pain, Bloating, Change in Bowel Habits, Chronic diarrhea, Constipation, Difficulty Swallowing, Excessive gas, Gets full quickly at meals, Nausea, Rectal Pain and Vomiting. Female Genitourinary Present- Pelvic Pain. Not Present- Frequency, Nocturia, Painful Urination and Urgency. Endocrine Present- Hot flashes. Not Present- Cold Intolerance, Excessive Hunger, Hair Changes, Heat Intolerance and New Diabetes.  BP 143/85 mmHg  Pulse 89  Temp(Src) 97.6 F (36.4 C) (Oral)  Resp 20  Ht 5\' 5"  (1.651 m)  Wt 48.988 kg (108 lb)  BMI 17.97 kg/m2  SpO2 99%  LMP 10/18/2015 (Exact Date)    Physical Exam Alexis Ok MD; 09/21/2015 2:12 PM) General Mental Status-Alert. General Appearance-Consistent with stated age. Hydration-Well hydrated. Voice-Normal.  Head and Neck Thyroid Gland Characteristics - Note: Multiple right inferior nodule.  Chest and Lung Exam Chest and lung exam reveals -quiet, even and easy respiratory effort with no use of accessory muscles and on auscultation, normal breath sounds, no adventitious sounds and normal vocal resonance. Inspection Chest Wall - Normal. Back - normal.  Cardiovascular Cardiovascular examination reveals -normal heart sounds, regular rate and rhythm with no murmurs and normal pedal pulses bilaterally.  Abdomen Inspection Inspection of the abdomen reveals - No Hernias. Skin - Scar - no surgical scars. Palpation/Percussion Palpation and Percussion of the abdomen reveal - Soft, Non Tender, No Rebound tenderness, No Rigidity (guarding) and No hepatosplenomegaly. Auscultation Auscultation of the  abdomen reveals - Bowel sounds normal.    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 Will proceed Scarpetta for right thyroid lobectomy 2. All risks and benefits were discussed with the patient to generally include: infection, bleeding, damage to the recurrent laryngeal nerve, damage to parathyroid glands, and possible need for further surgery. Alternatives were offered and described. All questions were answered and the patient voiced understanding of the procedure and wishes to proceed.

## 2015-10-22 NOTE — Transfer of Care (Signed)
Immediate Anesthesia Transfer of Care Note  Patient: Alexis Rose  Procedure(s) Performed: Procedure(s): RIGHT THYROID LOBECTOMY (Right)  Patient Location: PACU  Anesthesia Type:General  Level of Consciousness: awake, alert  and oriented  Airway & Oxygen Therapy: Patient Spontanous Breathing  Post-op Assessment: Report given to RN and Post -op Vital signs reviewed and stable  Post vital signs: Reviewed and stable  Last Vitals:  Filed Vitals:   10/22/15 1014 10/22/15 1348  BP: 143/85 128/81  Pulse: 89   Temp: 36.4 C 36.4 C  Resp: 20     Last Pain:  Filed Vitals:   10/22/15 1351  PainSc: Asleep      Patients Stated Pain Goal: 2 ([id number])  Complications: No apparent anesthesia complications

## 2015-10-22 NOTE — Op Note (Signed)
10/22/2015  1:40 PM  PATIENT:  Alexis Rose  50 y.o. female  PRE-OPERATIVE DIAGNOSIS:  Right thyroid nodule   POST-OPERATIVE DIAGNOSIS:  Right thyroid nodule   PROCEDURE:  Procedure(s): RIGHT THYROID LOBECTOMY (Right)  SURGEON:  Surgeon(s) and Role:    * Ralene Ok, MD - Primary    * Leighton Ruff, MD - Assisting  ANESTHESIA:   local and general  EBL:5cc   Total I/O In: 1000 [I.V.:1000] Out: -   BLOOD ADMINISTERED:none  DRAINS: none   LOCAL MEDICATIONS USED:  BUPIVICAINE   SPECIMEN:  Source of Specimen:  R thyroid lobe, stitch marks the superior lobe  DISPOSITION OF SPECIMEN:  PATHOLOGY  COUNTS:  YES  TOURNIQUET:  * No tourniquets in log *  DICTATION: .Dragon Dictation  Findings: R thyroid nodule  Indications for procedure: The patient is a 50 who had a right thyroid nodule that showed possible Hurthle cells.  She was taken back for R thyroid lobectomy  Details of the procedure:  The patient was taken back to the operating room. The patient was placed in supine position with bilateral SCDs in place.  The patient was prepped and draped in the usual sterile fashion. After appropriate anitbiotics were confirmed, a time-out was confirmed and all facts were verified.   A 4 cm incision was made approximately 2 fingerbreadths above the sternal notch. Bovie cautery was used to maintain hemostasis dissection was carried down through the platysma. The platysma was elevated and flaps were created superiorly and inferiorly to the thyroid cartilage as well as the sternal notch, repsectively. The strap muscles were identified in the midline and separated. Right-sided strap muscles were elevated off the anterior surface of the thyroid. This dissection was carried laterally. The middle thyroid vein was identified and doubly ligated. We proceeded to dissect away the superior lobe and Kitners were used to gently dissect the surrounding musculature from the thyroid. The superior  thyroid vessels were identified and doubly ligated with clips and transected with a Harmonic scalpel. At this time this freed up the superior lobe was able to deliver this into the wound. We also identified the superior parathyroid gland which we preserved.   We continued to dissect the thyroid off of the trachea from lateral to medial direction. The right recurrent laryngeal nerve was identified and protected.  The inferior thyroid vessels were identified ligated with clips. The right inferior parathyroid gland was seen and preserved.  At this time Berry's ligament was dissected away from the trachea. This delivered the right lobe of the thyroid into the wound and the harmonic scalpel was used to divide the thyroid in the midline. A superior stitch was then placed in the superior thyroid lobe.   The area was irrigated out. The dissection bed was hemostatic. We placed fibrillar hemostatic agent into the wound. Strap muscles were then reapproximated in the midline with interrupted 3-0 Vicryl stitches. The platysma was reapproximated using 3-0 Vicryl stitches in interrupted fashion. Skin was then reapproximated using a running subcuticular 4-0 Monocryl. The skin was then dressed with Dermabond. The patient was taken to the recovery room in stable condition.   PLAN OF CARE: Admit for overnight observation  PATIENT DISPOSITION:  PACU - hemodynamically stable.   Delay start of Pharmacological VTE agent (>24hrs) due to surgical blood loss or risk of bleeding: not applicable

## 2015-10-22 NOTE — Anesthesia Procedure Notes (Signed)
Procedure Name: Intubation Date/Time: 10/22/2015 12:19 PM Performed by: Manuela Schwartz B Pre-anesthesia Checklist: Patient identified, Emergency Drugs available, Suction available, Patient being monitored and Timeout performed Patient Re-evaluated:Patient Re-evaluated prior to inductionOxygen Delivery Method: Circle system utilized Preoxygenation: Pre-oxygenation with 100% oxygen Intubation Type: IV induction Ventilation: Mask ventilation without difficulty Laryngoscope Size: Mac and 3 Grade View: Grade I Tube type: Oral Tube size: 7.0 mm Number of attempts: 1 Airway Equipment and Method: Stylet Placement Confirmation: ETT inserted through vocal cords under direct vision,  positive ETCO2 and breath sounds checked- equal and bilateral Secured at: 21 cm Tube secured with: Tape Dental Injury: Teeth and Oropharynx as per pre-operative assessment

## 2015-10-22 NOTE — Anesthesia Preprocedure Evaluation (Signed)
Anesthesia Evaluation  Patient identified by MRN, date of birth, ID band Patient awake    Reviewed: Allergy & Precautions, NPO status , Patient's Chart, lab work & pertinent test results  Airway Mallampati: II  TM Distance: >3 FB Neck ROM: Full    Dental no notable dental hx.    Pulmonary neg pulmonary ROS,    Pulmonary exam normal breath sounds clear to auscultation       Cardiovascular negative cardio ROS Normal cardiovascular exam Rhythm:Regular Rate:Normal     Neuro/Psych negative neurological ROS  negative psych ROS   GI/Hepatic negative GI ROS, Neg liver ROS,   Endo/Other  negative endocrine ROS  Renal/GU negative Renal ROS  negative genitourinary   Musculoskeletal negative musculoskeletal ROS (+)   Abdominal   Peds negative pediatric ROS (+)  Hematology negative hematology ROS (+)   Anesthesia Other Findings   Reproductive/Obstetrics negative OB ROS                             Anesthesia Physical Anesthesia Plan  ASA: II  Anesthesia Plan: General   Post-op Pain Management:    Induction: Intravenous  Airway Management Planned: Oral ETT  Additional Equipment:   Intra-op Plan:   Post-operative Plan: Extubation in OR  Informed Consent: I have reviewed the patients History and Physical, chart, labs and discussed the procedure including the risks, benefits and alternatives for the proposed anesthesia with the patient or authorized representative who has indicated his/her understanding and acceptance.   Dental advisory given  Plan Discussed with: CRNA  Anesthesia Plan Comments:         Anesthesia Quick Evaluation  

## 2015-10-23 DIAGNOSIS — D34 Benign neoplasm of thyroid gland: Secondary | ICD-10-CM | POA: Diagnosis not present

## 2015-10-23 LAB — BASIC METABOLIC PANEL
Anion gap: 13 (ref 5–15)
BUN: <5 mg/dL — ABNORMAL LOW (ref 6–20)
CO2: 24 mmol/L (ref 22–32)
Calcium: 9.4 mg/dL (ref 8.9–10.3)
Chloride: 105 mmol/L (ref 101–111)
Creatinine, Ser: 0.76 mg/dL (ref 0.44–1.00)
GFR calc Af Amer: >60 mL/min (ref >60)
GFR calc non Af Amer: >60 mL/min (ref >60)
Glucose, Bld: 116 mg/dL — ABNORMAL HIGH (ref 65–99)
Potassium: 4 mmol/L (ref 3.5–5.1)
Sodium: 142 mmol/L (ref 135–145)

## 2015-10-23 MED ORDER — FAMOTIDINE 20 MG PO TABS
20.0000 mg | ORAL_TABLET | Freq: Every day | ORAL | Status: DC | PRN
  Administered 2015-10-23: 20 mg via ORAL
  Filled 2015-10-23: qty 1

## 2015-10-23 MED ORDER — OXYCODONE HCL 5 MG PO TABS: 5.0000 mg | ORAL_TABLET | ORAL | Status: AC | PRN

## 2015-10-23 MED ORDER — ALUM & MAG HYDROXIDE-SIMETH 200-200-20 MG/5ML PO SUSP
30.0000 mL | ORAL | Status: DC | PRN
  Administered 2015-10-23: 30 mL via ORAL
  Filled 2015-10-23: qty 30

## 2015-10-23 MED ORDER — FAMOTIDINE 10 MG PO TABS: ORAL_TABLET | ORAL | Status: AC

## 2015-10-23 MED ORDER — ALUM & MAG HYDROXIDE-SIMETH 200-200-20 MG/5ML PO SUSP: 30.0000 mL | ORAL | Status: DC | PRN

## 2015-10-23 NOTE — Progress Notes (Signed)
1 Day Post-Op  Subjective: Sore, complaining of some GERD sx, had not be further than Bathroom.  I will give her some Maalox and Pepcid.  Site looks fine.  Objective: Vital signs in last 24 hours: Temp:  [97.5 F (36.4 C)-97.7 F (36.5 C)] 97.5 F (36.4 C) (04/29 0504) Pulse Rate:  [81-89] 81 (04/29 0504) Resp:  [18-27] 20 (04/29 0504) BP: (108-143)/(65-85) 108/65 mmHg (04/29 0504) SpO2:  [98 %-100 %] 99 % (04/29 0504) Weight:  [48.988 kg (108 lb)] 48.988 kg (108 lb) (04/28 1014) Last BM Date: 10/22/15 PO 600  Soft diet Voided x 6 Afebrile, VSS BMP is fine.   Intake/Output from previous day: 04/28 0701 - 04/29 0700 In: [id number] [P.O.:600; I.V.:1125] Out: 10 [Blood:10] Intake/Output this shift:    General appearance: alert, cooperative and no distress Throat: incision looks fine, no swelling or erythema.   Resp: clear to auscultation bilaterally  Lab Results:  No results for input(s): WBC, HGB, HCT, PLT in the last 72 hours.  BMET  Recent Labs  10/23/15 0548  NA 142  K 4.0  CL 105  CO2 24  GLUCOSE 116*  BUN <5*  CREATININE 0.76  CALCIUM 9.4   PT/INR No results for input(s): LABPROT, INR in the last 72 hours.  No results for input(s): AST, ALT, ALKPHOS, BILITOT, PROT, ALBUMIN in the last 168 hours.   Lipase  No results found for: LIPASE   Studies/Results: No results found.  Medications:    . dextrose 5 % and 0.9% NaCl 75 mL/hr at 10/22/15 1520  . lactated ringers 10 mL/hr at 10/22/15 1023   Prior to Admission medications   Medication Sig Start Date End Date Taking? Authorizing Provider  FALMINA 0.1-20 MG-MCG tablet Take 1 tablet by mouth daily. 09/25/15  Yes Historical Provider, MD  fexofenadine (ALLEGRA) 180 MG tablet Take 1 tablet (180 mg total) by mouth daily. For allergies Patient taking differently: Take 180 mg by mouth daily.  01/12/14  Yes Thao P Le, DO  fluticasone (FLONASE) 50 MCG/ACT nasal spray Place 2 sprays into both nostrils daily. 07/30/13  Yes  Ryan M Dunn, PA-C  oxyCODONE-acetaminophen (ROXICET) 5-325 MG tablet Take 1-2 tablets by mouth every 4 (four) hours as needed. 10/22/15   Ralene Ok, MD    Assessment/Plan Right thyroid nodule S/p right thyroid lobectomy 10/22/15, Dr. Ralene Ok FEN:  Soft diet/IV fluids ID:  preop Ancef only VTE:  SCD    Plan: Maalox, pepcid, amubulate and if she does well home later today.  Ok after eating and mylanta.  She is ready to go home.Earnstine Regal 10/23/2015 [phone]

## 2015-10-23 NOTE — Discharge Instructions (Signed)
Thyroidectomy, Care After Refer to this sheet in the next few weeks. These instructions provide you with information about caring for yourself after your procedure. Your health care provider may also give you more specific instructions. Your treatment has been planned according to current medical practices, but problems sometimes occur. Call your health care provider if you have any problems or questions after your procedure. WHAT TO EXPECT AFTER THE PROCEDURE After your procedure, it is typical to have:  Mild pain in the neck or upper body, especially when swallowing.  A sore throat.  A weak voice. HOME CARE INSTRUCTIONS   Take medicines only as directed by your health care provider.  If your entire thyroid gland was removed, you may need to take thyroid hormone medicine from now on.  Do not take medicines that contain aspirin and ibuprofen until your health care provider says that you can. These medicines can increase your risk of bleeding.  Some pain medicines cause constipation. Drink enough fluid to keep your urine clear or pale yellow. This can help to prevent constipation.  Start slowly with eating. You may need to have only liquids and soft foods for a few days or as directed by your health care provider.  Do not take baths, swim, or use a hot tub until your health care provider approves.  There are many different ways to close and cover an incision, including stitches (sutures), skin glue, and adhesive strips. Follow your health care provider's instructions for:  Incision care.  Bandage (dressing) changes and removal.  Incision closure removal.  Resume your usual activities as directed by your health care provider.  For the first 10 days after the procedure or as instructed by your health care provider:  Do not lift anything heavier than 20 lb (9.1 kg).  Do not jog, swim, or do other strenuous exercises.  Do not play contact sports.  Keep all follow-up visits as  directed by your health care provider. This is important. SEEK MEDICAL CARE IF:  The soreness in your throat gets worse.  You have increased pain at your incision or incisions.  You have increased bleeding from an incision.  Your incision becomes infected. Watch for:  Swelling.  Redness.  Warmth.  Pus.  You notice a bad smell coming from an incision or dressing.  You have a fever.  You feel lightheaded or faint.  You have numbness, tingling, or muscle spasms in your:  Arms.  Hands.  Feet.  Face.  You have trouble swallowing. SEEK IMMEDIATE MEDICAL CARE IF:   You develop a rash.  You have difficulty breathing.  You hear whistling noises coming from your chest.  You develop a cough that gets worse.  Your speech changes, or you have hoarseness that gets worse.   This information is not intended to replace advice given to you by your health care provider. Make sure you discuss any questions you have with your health care provider.   Document Released: 12/30/2004 Document Revised: 07/03/2014 Document Reviewed: 11/12/2013 Elsevier Interactive Patient Education 2016 [place].  Managing Pain  Pain after surgery or related to activity is often due to strain/injury to muscle, tendon, nerves and/or incisions.  This pain is usually short-term and will improve in a few months.   Many people find it helpful to do the following things TOGETHER to help speed the process of healing and to get back to regular activity more quickly:  1. Avoid heavy physical activity at first a. No lifting greater than  20 pounds at first, then increase to lifting as tolerated over the next few weeks b. Do not push through the pain.  Listen to your body and avoid positions and maneuvers than reproduce the pain.  Wait a few days before trying something more intense c. Walking is okay as tolerated, but go slowly and stop when getting sore.  If you can walk 30 minutes without stopping or  pain, you can try more intense activity (running, jogging, aerobics, cycling, swimming, treadmill, sex, sports, weightlifting, etc ) d. Remember: If it hurts to do it, then dont do it!  2. Take Acetaminophen Anti-inflammatory medication i. Acetaminophen 500mg  tabs (Tylenol) 1-2 pills with every meal and just before bedtime (avoid if you have liver problems) ii. Take with food/snack around the clock for 1-2 weeks iii. This helps the muscle and nerve tissues become less irritable and calm down faster  3. Use a Heating pad or Ice/Cold Pack a. 4-6 times a day b. May use warm bath/hottub  or showers  4. Try Gentle Massage and/or Stretching  a. at the area of pain many times a day b. stop if you feel pain - do not overdo it  Try these steps together to help you body heal faster and avoid making things get worse.  Doing just one of these things may not be enough.    If you are not getting better after two weeks or are noticing you are getting worse, contact our office for further advice; we may need to re-evaluate you & see what other things we can do to help.

## 2015-10-25 ENCOUNTER — Encounter (HOSPITAL_COMMUNITY): Payer: Self-pay | Admitting: General Surgery

## 2015-10-25 NOTE — Discharge Summary (Signed)
Physician Discharge Summary  Patient ID: Alexis Rose MRN: [id number] DOB/AGE: 1965-11-25 50 y.o.  Admit date: 10/22/2015 Discharge date: 10/25/2015  Admission Diagnoses:status post partial thyroidectomy  Discharge Diagnoses:  Active Problems:   S/P partial thyroidectomy   Discharged Condition: good  Hospital Course: patient is a 50 year old female status post partial thyroidectomy secondary to thyroid nodule.  Please see operative note for full details.  Patient was sent to the floor postoperatively.  Patient was having good pain control started a liquid diet.  She tolerated this well.  She was slowly advanced to regular diet.  She is also tolerating that well.  She had no competitions postoply.  She was detail for scharge on postoperative day 1.  Consults: None  Significant Diagnostic Studies: none  Treatments: surgery: as above  Discharge Exam: Blood pressure 108/65, pulse 81, temperature 97.5 F (36.4 C), temperature source Oral, resp. rate 20, height 5\' 5"  (1.651 m), weight 48.988 kg (108 lb), last menstrual period 10/18/2015, SpO2 99 %. General appearance: alert and cooperative Neck: incision clean dry and intact.  Disposition: 01-Home or Self Care  Discharge Instructions    Call MD for:  extreme fatigue    Complete by:  As directed      Call MD for:  hives    Complete by:  As directed      Call MD for:  persistant nausea and vomiting    Complete by:  As directed      Call MD for:  redness, tenderness, or signs of infection (pain, swelling, redness, odor or green/yellow discharge around incision site)    Complete by:  As directed      Call MD for:  severe uncontrolled pain    Complete by:  As directed      Call MD for:    Complete by:  As directed   Temperature > 101.55F     Diet - low sodium heart healthy    Complete by:  As directed      Diet - low sodium heart healthy    Complete by:  As directed   Start with bland, low residue diet for a few days, then  advance to a heart healthy (low fat, high fiber) diet.  If you feel nauseated or constipated, simplify to a liquid only diet for 48 hours until you are feeling better (no more nausea, farting/passing gas, having a bowel movement, etc...).  If you cannot tolerate even drinking liquids, or feeling worse, let your surgeon know or go to the Emergency Department for help.     Discharge instructions    Complete by:  As directed   Please see discharge instruction sheets.   Also refer to any handouts/printouts that may have been given from the [facility] surgery office (if you visited [place] there before surgery) Please call our office if you have any questions or concerns [phone]     Discharge wound care:    Complete by:  As directed   If you have closed incisions: Shower and bathe over these incisions with soap and water every day.  It is OK to wash over the dressings: they are waterproof. Remove all surgical dressings on postoperative day #3.  You do not need to replace dressings over the closed incisions unless you feel more comfortable with a Band-Aid covering it.   If you have an open wound: That requires packing, so please see wound care instructions.   In general, remove all dressings, wash wound with soap and  water and then replace with saline moistened gauze.  Do the dressing change at least every day.    Please call our office [phone] if you have further questions.     Driving Restrictions    Complete by:  As directed   No driving until off narcotics and can safely swerve away without pain during an emergency     Increase activity slowly    Complete by:  As directed      Increase activity slowly    Complete by:  As directed   Walk an hour a day.  Use 20-30 minute walks.  When you can walk 30 minutes without difficulty, it is fine to restart low impact/moderate activities such as biking, jogging, swimming, sexual activity, etc.  Eventually you can increase to unrestricted activity when not  feeling pain.  If you feel pain: STOP!Marland Kitchen   Let pain protect you from overdoing it.  Use ice/heat & over-the-counter pain medications to help minimize soreness.  If that is not enough, then use your narcotic pain prescription as needed to remain active.  It is better to take extra pain medications and be more active than to stay bedridden to avoid all pain medications.     Lifting restrictions    Complete by:  As directed   Avoid heavy lifting initially, <20 pounds at first.   Do not push through pain.   You have no specific weight limit: If it hurts to do, DON'T DO IT.    If you feel no pain, you are not injuring anything.  Pain will protect you from injury.   Coughing and sneezing are far more stressful to your incision than any lifting.   Avoid resuming heavy lifting (>50 pounds) or other intense activity until off all narcotic pain medications.   When want to exercise more, give yourself 2 weeks to gradually get back to full intense exercise/activity.     May shower / Bathe    Complete by:  As directed   [place].  It is fine for dressings or wounds to be washed/rinsed.  Use gentle soap & water.  This will help the incisions and/or wounds get clean & minimize infection.     May walk up steps    Complete by:  As directed      Sexual Activity Restrictions    Complete by:  As directed   Sexual activity as tolerated.  Do not push through pain.  Pain will protect you from injury.     Walk with assistance    Complete by:  As directed   Walk over an hour a day.  May use a walker/cane/companion to help with balance and stamina.            Medication List    TAKE these medications        FALMINA 0.1-20 MG-MCG tablet  Generic drug:  levonorgestrel-ethinyl estradiol  Take 1 tablet by mouth daily.     famotidine 10 MG tablet  Commonly known as:  PEPCID  You can buy this at any drug store and use as instructed once daily for heartburn/acid indigestion.     fexofenadine 180 MG  tablet  Commonly known as:  ALLEGRA  Take 1 tablet (180 mg total) by mouth daily. For allergies     fluticasone 50 MCG/ACT nasal spray  Commonly known as:  FLONASE  Place 2 sprays into both nostrils daily.     oxyCODONE 5 MG immediate release tablet  Commonly known  as:  Oxy IR/ROXICODONE  Take 1-2 tablets (5-10 mg total) by mouth every 4 (four) hours as needed for moderate pain.     oxyCODONE-acetaminophen 5-325 MG tablet  Commonly known as:  ROXICET  Take 1-2 tablets by mouth every 4 (four) hours as needed.           Follow-up Information    Follow up with Reyes Ivan, MD. Schedule an appointment as soon as possible for a visit in 3 weeks.   Specialty:  General Surgery   Why:  For wound re-check   Contact information:   [place] [address] [phone]       Signed: Rosario Jacks., Anne Hahn 10/25/2015, 7:59 AM

## 2015-10-25 NOTE — Anesthesia Postprocedure Evaluation (Signed)
Anesthesia Post Note  Patient: Alexis Rose  Procedure(s) Performed: Procedure(s) (LRB): RIGHT THYROID LOBECTOMY (Right)  Patient location during evaluation: PACU Anesthesia Type: General Level of consciousness: awake and alert Pain management: pain level controlled Vital Signs Assessment: post-procedure vital signs reviewed and stable Respiratory status: spontaneous breathing, nonlabored ventilation, respiratory function stable and patient connected to nasal cannula oxygen Cardiovascular status: blood pressure returned to baseline and stable Postop Assessment: no signs of nausea or vomiting Anesthetic complications: no    Last Vitals:  Filed Vitals:   10/22/15 2010 10/23/15 0504  BP: 120/72 108/65  Pulse: 84 81  Temp: 36.5 C 36.4 C  Resp: 18 20    Last Pain:  Filed Vitals:   10/23/15 1130  PainSc: 0-No pain                 Montez Hageman

## 2016-02-29 ENCOUNTER — Other Ambulatory Visit: Payer: Self-pay | Admitting: Family Medicine

## 2016-02-29 DIAGNOSIS — Z1231 Encounter for screening mammogram for malignant neoplasm of breast: Secondary | ICD-10-CM

## 2016-03-06 ENCOUNTER — Ambulatory Visit
Admission: RE | Admit: 2016-03-06 | Discharge: 2016-03-06 | Disposition: A | Discharge status: Home / Self Care | Payer: BLUE CROSS/BLUE SHIELD | Source: Ambulatory Visit | Attending: Family Medicine | Admitting: Family Medicine

## 2016-03-06 DIAGNOSIS — Z1231 Encounter for screening mammogram for malignant neoplasm of breast: Secondary | ICD-10-CM

## 2017-03-12 ENCOUNTER — Other Ambulatory Visit: Payer: Self-pay | Admitting: Family Medicine

## 2017-03-12 DIAGNOSIS — Z1239 Encounter for other screening for malignant neoplasm of breast: Secondary | ICD-10-CM

## 2017-03-15 ENCOUNTER — Ambulatory Visit
Admission: RE | Admit: 2017-03-15 | Discharge: 2017-03-15 | Disposition: A | Discharge status: Home / Self Care | Payer: BLUE CROSS/BLUE SHIELD | Source: Ambulatory Visit | Attending: Family Medicine | Admitting: Family Medicine

## 2017-03-15 DIAGNOSIS — Z1239 Encounter for other screening for malignant neoplasm of breast: Secondary | ICD-10-CM

## 2017-04-26 IMAGING — CR DG CHEST 2V
2 series · 2 of 2 positions shown · non-contrast
Comparison: PA and lateral chest x-ray July 29, 2012

CLINICAL DATA: Preoperative exam prior right thyroid lobectomy;
patient reports dyspnea on exertion, 1 month of cough which is now
improving, occasional chills, mid chest pain with activity.

EXAM:
CHEST  2 VIEW

[w chest pa]
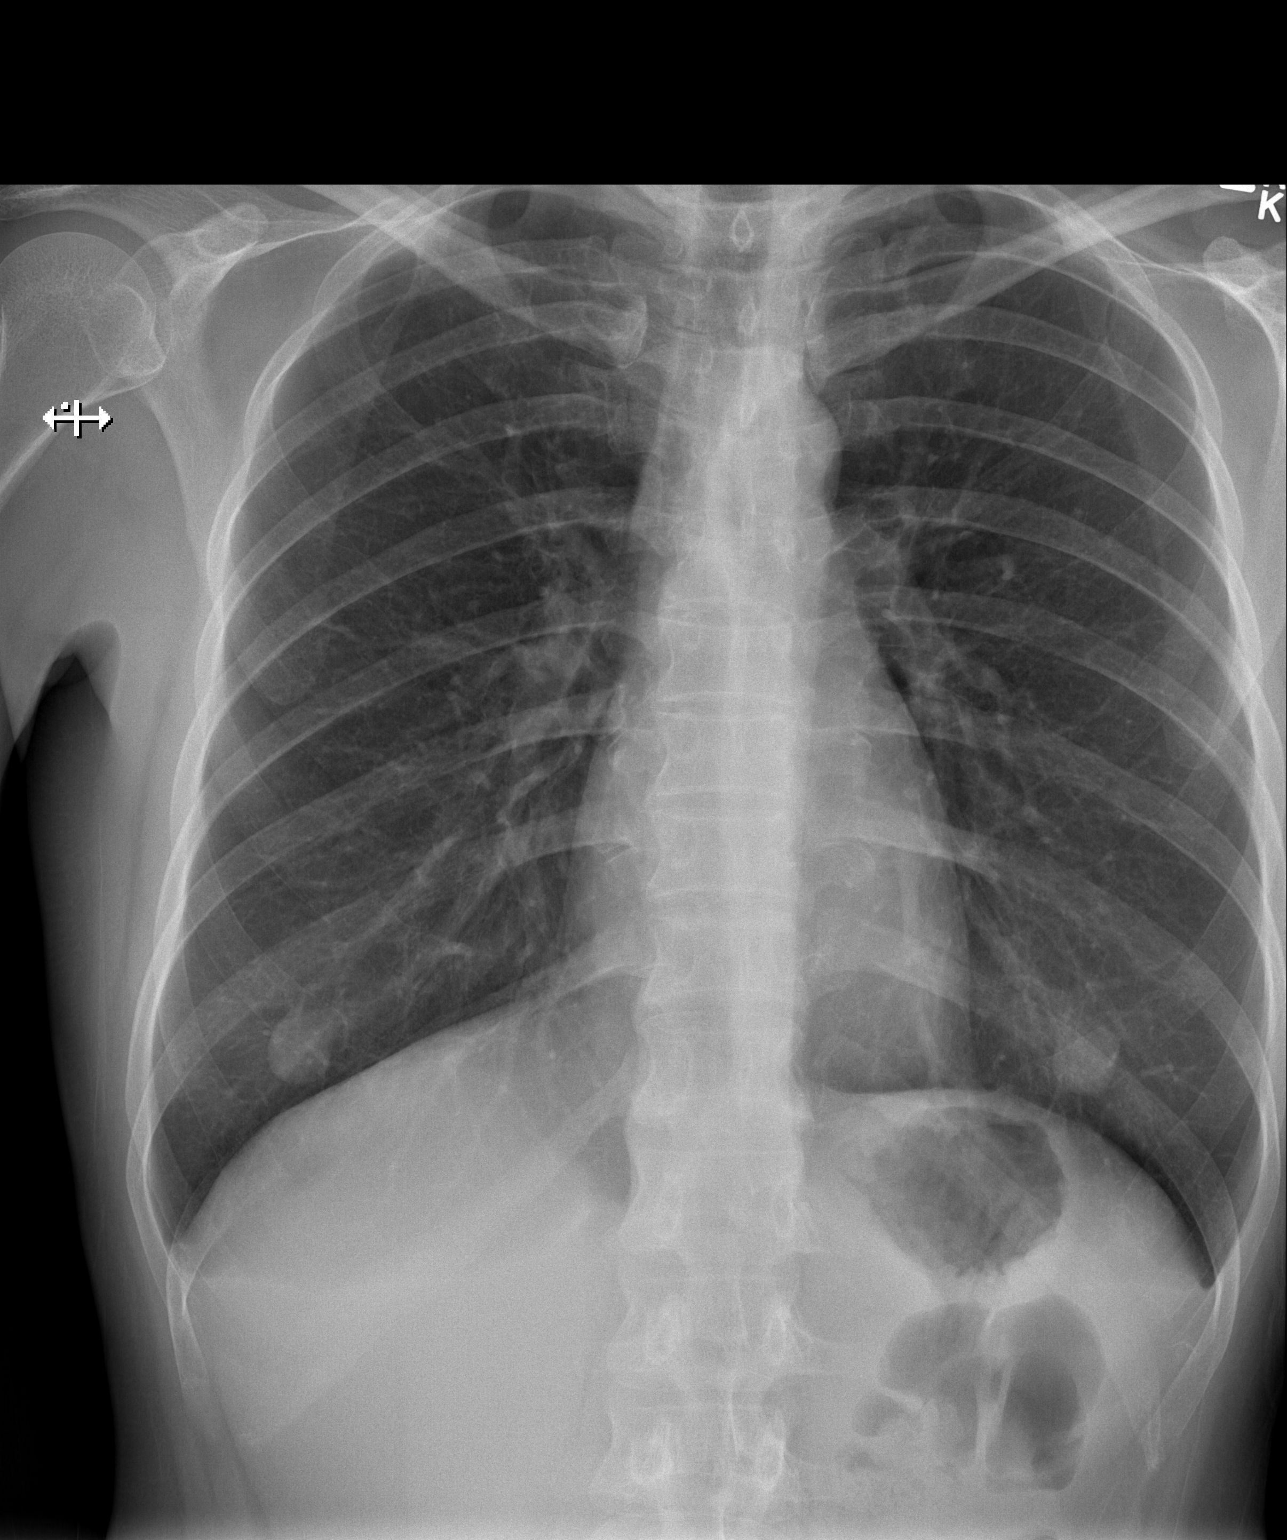

[w chest lat]
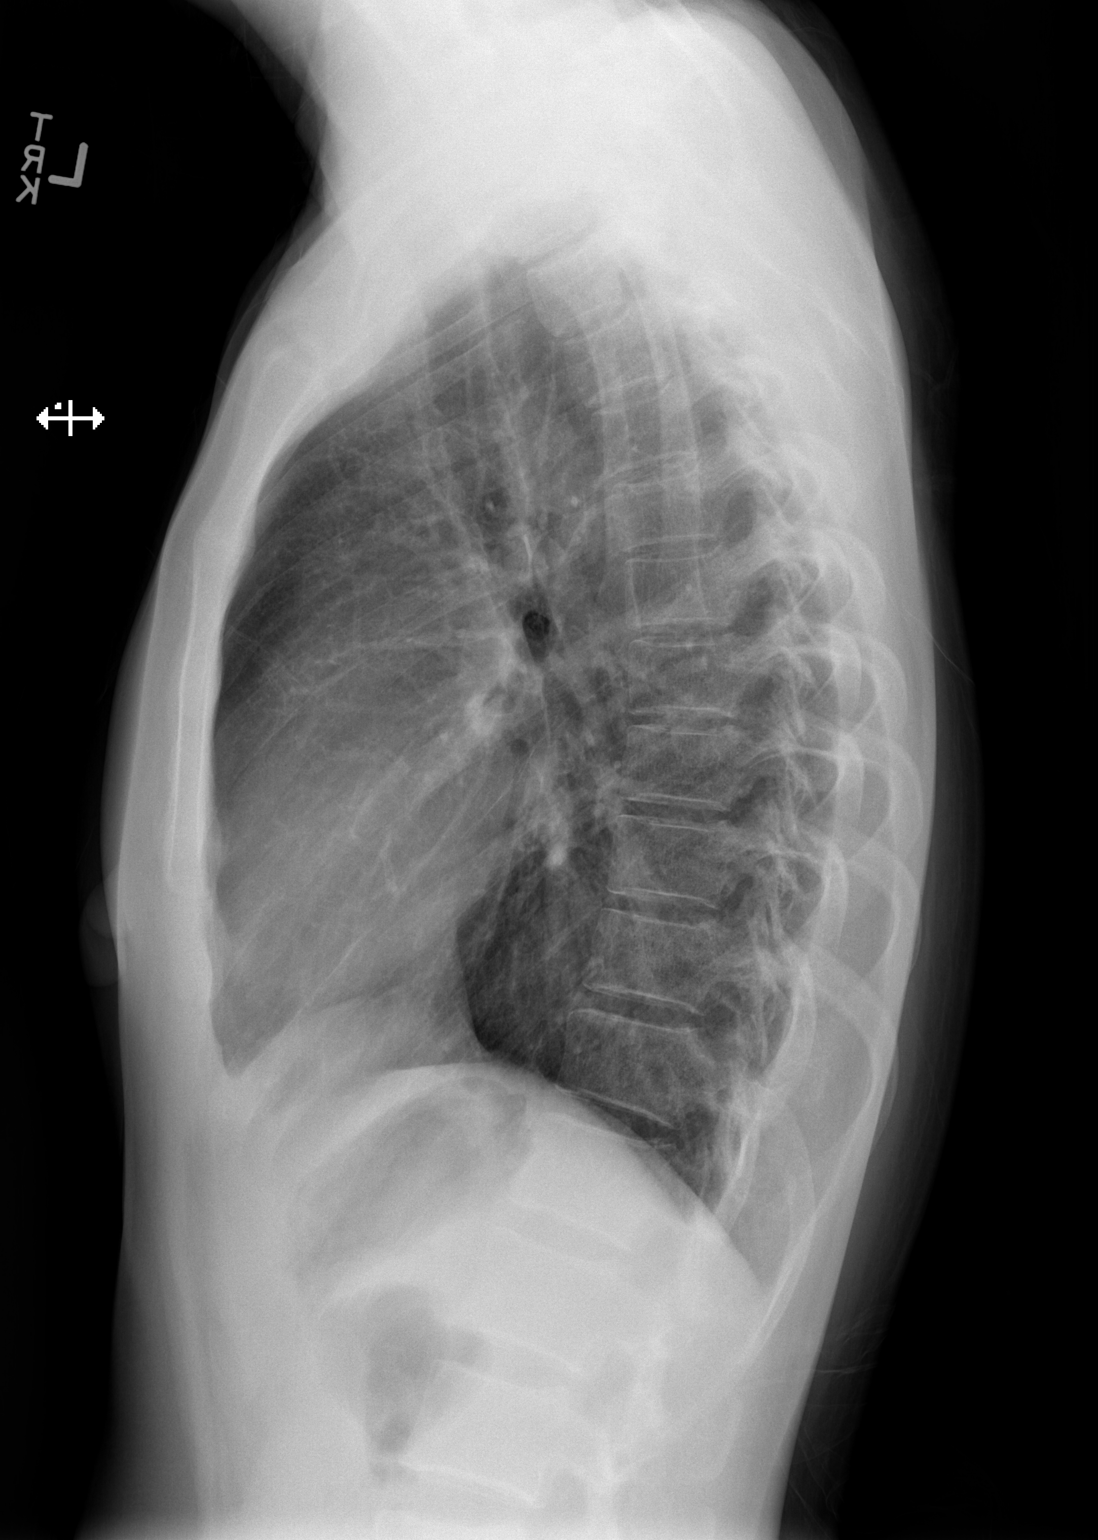

[2 of 2 positions shown; findings below may reference images not displayed]

FINDINGS: The lungs are mildly hyperinflated but clear. Nipple shadows are
prominent bilaterally. The heart and pulmonary vascularity are
normal. The mediastinum is normal in width. There is no pleural
effusion. The bony thorax exhibits no acute abnormality.
IMPRESSION: Hyperinflation consistent with COPD or reactive airway disease.
There is no evidence of pneumonia nor CHF.

## 2017-09-25 ENCOUNTER — Other Ambulatory Visit: Payer: Self-pay | Admitting: Family Medicine

## 2017-09-25 ENCOUNTER — Ambulatory Visit
Admission: RE | Admit: 2017-09-25 | Discharge: 2017-09-25 | Disposition: A | Discharge status: Home / Self Care | Payer: BLUE CROSS/BLUE SHIELD | Source: Ambulatory Visit | Attending: Family Medicine | Admitting: Family Medicine

## 2017-09-25 DIAGNOSIS — M549 Dorsalgia, unspecified: Secondary | ICD-10-CM

## 2018-03-15 ENCOUNTER — Other Ambulatory Visit: Payer: Self-pay | Admitting: Family Medicine

## 2018-03-15 DIAGNOSIS — Z1231 Encounter for screening mammogram for malignant neoplasm of breast: Secondary | ICD-10-CM

## 2018-03-19 ENCOUNTER — Other Ambulatory Visit: Payer: Self-pay | Admitting: Family Medicine

## 2018-03-19 DIAGNOSIS — N644 Mastodynia: Secondary | ICD-10-CM

## 2018-03-25 ENCOUNTER — Ambulatory Visit
Admission: RE | Admit: 2018-03-25 | Discharge: 2018-03-25 | Disposition: A | Discharge status: Home / Self Care | Payer: BLUE CROSS/BLUE SHIELD | Source: Ambulatory Visit | Attending: Family Medicine | Admitting: Family Medicine

## 2018-03-25 ENCOUNTER — Ambulatory Visit: Payer: BLUE CROSS/BLUE SHIELD

## 2018-03-25 DIAGNOSIS — N644 Mastodynia: Secondary | ICD-10-CM

## 2019-11-19 ENCOUNTER — Other Ambulatory Visit: Payer: Self-pay | Admitting: Family Medicine

## 2019-11-19 DIAGNOSIS — Z1231 Encounter for screening mammogram for malignant neoplasm of breast: Secondary | ICD-10-CM

## 2019-12-16 ENCOUNTER — Ambulatory Visit
Admission: RE | Admit: 2019-12-16 | Discharge: 2019-12-16 | Disposition: A | Discharge status: Home / Self Care | Payer: BLUE CROSS/BLUE SHIELD | Source: Ambulatory Visit | Attending: Family Medicine | Admitting: Family Medicine

## 2019-12-16 ENCOUNTER — Other Ambulatory Visit: Payer: Self-pay

## 2019-12-16 DIAGNOSIS — Z1231 Encounter for screening mammogram for malignant neoplasm of breast: Secondary | ICD-10-CM

## 2020-03-12 ENCOUNTER — Ambulatory Visit
Admission: RE | Admit: 2020-03-12 | Discharge: 2020-03-12 | Disposition: A | Discharge status: Home / Self Care | Payer: 59 | Source: Ambulatory Visit | Attending: Family Medicine | Admitting: Family Medicine

## 2020-03-12 ENCOUNTER — Other Ambulatory Visit: Payer: Self-pay | Admitting: Family Medicine

## 2020-03-12 DIAGNOSIS — M545 Low back pain, unspecified: Secondary | ICD-10-CM

## 2020-03-19 ENCOUNTER — Other Ambulatory Visit: Payer: Self-pay | Admitting: Family Medicine

## 2020-03-25 ENCOUNTER — Other Ambulatory Visit: Payer: Self-pay | Admitting: Family Medicine

## 2020-03-25 DIAGNOSIS — M5441 Lumbago with sciatica, right side: Secondary | ICD-10-CM

## 2020-04-18 ENCOUNTER — Ambulatory Visit
Admission: RE | Admit: 2020-04-18 | Discharge: 2020-04-18 | Disposition: A | Discharge status: Home / Self Care | Payer: 59 | Source: Ambulatory Visit | Attending: Family Medicine | Admitting: Family Medicine

## 2020-04-18 ENCOUNTER — Other Ambulatory Visit: Payer: Self-pay

## 2020-04-18 DIAGNOSIS — M5441 Lumbago with sciatica, right side: Secondary | ICD-10-CM

## 2020-06-29 ENCOUNTER — Other Ambulatory Visit: Payer: Self-pay

## 2020-06-29 ENCOUNTER — Ambulatory Visit: Payer: 59 | Attending: Physician Assistant | Admitting: Physical Therapy

## 2020-06-29 ENCOUNTER — Encounter: Payer: Self-pay | Admitting: Physical Therapy

## 2020-06-29 DIAGNOSIS — M545 Low back pain, unspecified: Secondary | ICD-10-CM | POA: Diagnosis not present

## 2020-06-29 DIAGNOSIS — G8929 Other chronic pain: Secondary | ICD-10-CM | POA: Diagnosis present

## 2020-06-29 DIAGNOSIS — M6281 Muscle weakness (generalized): Secondary | ICD-10-CM

## 2020-06-29 NOTE — Therapy (Signed)
[place], [place], [address] Phone: [phone]   Fax:  [phone]  Physical Therapy Evaluation  Patient Details  Name: Alexis Rose MRN: [id number] Date of Birth: [birth year] Referring Provider (PT): Traci Sermon, [place]   Encounter Date: 06/29/2020   PT End of Session - 06/29/20 1319    Visit Number 1    Number of Visits 8    Date for PT Re-Evaluation 08/24/20    Authorization Type [facility]    PT Start Time 1215    PT Stop Time 1300    PT Time Calculation (min) 45 min    Activity Tolerance Patient tolerated treatment well    Behavior During Therapy [facility] for tasks assessed/performed           Past Medical History:  Diagnosis Date  . Allergy   . Anxiety   . GERD (gastroesophageal reflux disease)   . Headache   . Pneumonia   . Wears glasses    reading    Past Surgical History:  Procedure Laterality Date  . CESAREAN SECTION    . CLOSED REDUCTION FINGER WITH PERCUTANEOUS PINNING Right 09/02/2014   Procedure: CLOSED REDUCTION PERCUTANEOUS PINNING RIGHT INDEX P-2 FRACTURE ;  Surgeon: Charlotte Crumb, MD;  Location: [place];  Service: Orthopedics;  Laterality: Right;  . THYROID LOBECTOMY Right 10/22/2015   Procedure: RIGHT THYROID LOBECTOMY;  Surgeon: Ralene Ok, MD;  Location: [facility];  Service: General;  Laterality: Right;    There were no vitals filed for this visit.    Subjective Assessment - 06/29/20 1219    Subjective Patient reports both sides of lower back she has numbing/aching sensations. This has been going on since August 2021 and began when she was lifting a heavy container, she heard her back pop and it has not been the same since. Physical activity and heavy lifting make her pain worse. When pain is worse she can have it radiate down her leg. Patient works as a Manufacturing systems engineer and when working she will have pain when holding someones leg. She massages the  area and takes pain killer to help with the pain. She also has soreness when she presses on both side and once area on the middle of her back.    Patient is accompained by: Interpreter   In-person interpreter   Limitations Lifting    How long can you sit comfortably? 15-20 minutes    How long can you stand comfortably? No limitation    How long can you walk comfortably? No limitation    Diagnostic tests X-ray, MRI    Patient Stated Goals Alleviate low back pain, learn ways to improve pain and get back to exercising    Currently in Pain? Yes    Pain Score 7    0/10 at best   Pain Location Back    Pain Orientation Right;Left;Lower    Pain Descriptors / Indicators Numbness;Aching    Pain Type Chronic pain    Pain Onset More than a month ago    Pain Frequency Intermittent    Aggravating Factors  Physical exertion, lifting, sitting extended    Pain Relieving Factors Massaging the area, lying on back, medication              [facility] PT Assessment - 06/29/20 0001      Assessment   Medical Diagnosis Low back pain    Referring Provider (PT) Costella, Vista Mink, PA-C    Onset Date/Surgical  Date 02/05/20    Next MD Visit Unknown    Prior Therapy Yes      Precautions   Precautions None      Restrictions   Weight Bearing Restrictions No      Balance Screen   Has the patient fallen in the past 6 months No    Has the patient had a decrease in activity level because of a fear of falling?  No    Is the patient reluctant to leave their home because of a fear of falling?  No      Prior Function   Level of Independence Independent    Vocation Full time employment    Biomedical engineer      Cognition   Overall Cognitive Status Within Functional Limits for tasks assessed      Observation/Other Assessments   Observations Patient appears in no apparent distress    Focus on Therapeutic Outcomes (FOTO)  NA - language barrier      Sensation   Light Touch Appears Intact     Additional Comments No dermatomal impairments reported      Coordination   Gross Motor Movements are Fluid and Coordinated Yes      Posture/Postural Control   Posture Comments Rounded shoulder posture, decreased lumbar lordosis      ROM / Strength   AROM / PROM / Strength AROM;PROM;Strength      AROM   AROM Assessment Site Lumbar    Lumbar Flexion WFL    Lumbar Extension 75%   lower lumbar midline pain   Lumbar - Right Side Bend WFL    Lumbar - Left Side Bend WFL    Lumbar - Right Rotation WFL    Lumbar - Left Rotation WFL      PROM   Overall PROM Comments Hip PROM grossly WFL and non-painful      Strength   Overall Strength Comments Difficulty with lumbopelvic control with DLLT    Strength Assessment Site Hip;Knee    Right/Left Hip Right;Left    Right Hip Flexion 4/5    Right Hip Extension 4-/5    Right Hip ABduction 4-/5    Left Hip Flexion 4/5    Left Hip Extension 4-/5    Left Hip ABduction 4-/5    Right/Left Knee Right;Left    Right Knee Flexion 5/5    Right Knee Extension 5/5    Left Knee Flexion 5/5    Left Knee Extension 5/5      Flexibility   Soft Tissue Assessment /Muscle Length yes    Hamstrings WFL - pulling in hamstring region bilaterally    Piriformis WFL - pulling in deep hip region      Palpation   Spinal mobility Increased concordant midline lower lumbar pain with CPA to lower lumber region - improved with continued mobs    SI assessment  Not assessed    Palpation comment TTP right lumbar paraspinals with increased tightness      Special Tests   Other special tests No specific radicular symptoms with SLR or slump      Transfers   Transfers Independent with all Transfers                      Objective measurements completed on examination: See above findings.       [facility] Adult PT Treatment/Exercise - 06/29/20 0001      Exercises   Exercises Lumbar      Lumbar Exercises: Stretches  Other Lumbar Stretch Exercise Child's  pose lateral x 30 sec each      Lumbar Exercises: Supine   Bridge 10 reps    Bridge Limitations focus on gluteal/abdominal engagement      Lumbar Exercises: Sidelying   Clam 10 reps   yellow band     Lumbar Exercises: Quadruped   Mad[place]/Old Horse 10 reps    Madcat/Old Horse Limitations focus on lumbopelvic control, moving through comfortable extension range      Manual Therapy   Manual Therapy Joint mobilization;Soft tissue mobilization    Joint Mobilization CPA to lower lumber region grade III-IV    Soft tissue mobilization Right lumbar paraspinals                  PT Education - 06/29/20 1318    Education Details Exam findings, POC, HEP    Person(s) Educated Patient    Methods Explanation;Demonstration;Tactile cues;Verbal cues;Handout    Comprehension Verbalized understanding;Returned demonstration;Verbal cues required;Tactile cues required;Need further instruction            PT Short Term Goals - 06/29/20 1331      PT SHORT TERM GOAL #1   Title Patient will be I with initial HEP to progress with PT    Time 4    Period Weeks    Status New    Target Date 07/27/20      PT SHORT TERM GOAL #2   Title Patient will demonstrate lumbar mobility WFL and non-painful to improve ability to do household tasks    Time 4    Period Weeks    Status New    Target Date 07/27/20      PT SHORT TERM GOAL #3   Title Patient will be able to sit >/= 30 minutes without increase in pain to improve working capacity    Time 4    Period Weeks    Status New    Target Date 07/27/20             PT Long Term Goals - 06/29/20 1333      PT LONG TERM GOAL #1   Title Patient will be I with final HEP to maintain progress from PT    Time 8    Period Weeks    Status New    Target Date 08/24/20      PT LONG TERM GOAL #2   Title Patient will exhibit improved core/hip strength >/= 4+/5 MMT to improve ability to perform physical activity without pain    Time 8    Period Weeks     Status New    Target Date 08/24/20      PT LONG TERM GOAL #3   Title Patient will be able to sit and perform working tasks >/= 1 hour without increase in symptoms or needing break    Time 8    Period Weeks    Status New    Target Date 08/24/20      PT LONG TERM GOAL #4   Title Patient will return to prior exercise level to improve functional ability    Time 8    Period Weeks    Status New    Target Date 08/24/20                  Plan - 06/29/20 1320    Clinical Impression Statement Patient presents to PT with report of chronic low back pain with numbness into bilateral LEs. The majority of the patient's  symptoms seem to be musculoskeletal in nature with mobiliy deficit of lower lumbar region for extension, increased right paraspinal tightness, and core/hip strength deficits with decreased lumbopelvic control. Patient not specifically report radicular symptoms with testing or direction preference, but she does report numbness into bilateral lower legs with worsening symptoms. Patient was provided with initial exercises and she would benefit from continued skilled PT to progress her mobility and strength in order to reduce pain and maximize her functional ability.    Personal Factors and Comorbidities Time since onset of injury/illness/exacerbation    Examination-Activity Limitations Sit;Lift    Examination-Participation Restrictions Occupation    Stability/Clinical Decision Making Stable/Uncomplicated    Clinical Decision Making Low    Rehab Potential Good    PT Frequency 1x / week    PT Duration 8 weeks    PT Treatment/Interventions ADLs/Self Care Home Management;Cryotherapy;Electrical Stimulation;Iontophoresis 4mg /ml Dexamethasone;Moist Heat;Traction;Ultrasound;Neuromuscular re-education;Balance training;Therapeutic exercise;Therapeutic activities;Functional mobility training;Stair training;Gait training;Patient/family education;Manual techniques;Dry needling;Passive range of  motion;Taping;Spinal Manipulations;Joint Manipulations    PT Next Visit Plan Review HEP and progress PRN, manual for lower lumber mobility, dry needling/manual for lumbar paraspinals, progress core and hip strengthening    PT Home Exercise Plan [id number]: cat camel, child's pose, bridge, clamshell with yellow    Consulted and Agree with Plan of Care Patient           Patient will benefit from skilled therapeutic intervention in order to improve the following deficits and impairments:  Decreased range of motion,Postural dysfunction,Pain,Decreased activity tolerance,Decreased strength  Visit Diagnosis: Chronic bilateral low back pain, unspecified whether sciatica present  Muscle weakness (generalized)     Problem List Patient Active Problem List   Diagnosis Date Noted  . S/P partial thyroidectomy 10/22/2015    10/24/2015, PT, DPT, LAT, ATC 06/29/20  1:50 PM Phone: [phone] Fax: [phone]   [facility] [address] [place], [place], [place] Phone: [phone]   Fax:  [phone]  Name: KAYRON KALMAR MRN: Oretha Caprice Date of Birth: 08/18/1965

## 2020-07-06 ENCOUNTER — Other Ambulatory Visit: Payer: Self-pay

## 2020-07-06 ENCOUNTER — Ambulatory Visit: Payer: 59 | Admitting: Physical Therapy

## 2020-07-06 DIAGNOSIS — M545 Low back pain, unspecified: Secondary | ICD-10-CM | POA: Diagnosis not present

## 2020-07-06 DIAGNOSIS — G8929 Other chronic pain: Secondary | ICD-10-CM

## 2020-07-06 DIAGNOSIS — M6281 Muscle weakness (generalized): Secondary | ICD-10-CM

## 2020-07-06 NOTE — Therapy (Signed)
Alexis Rose, [place], [address] Phone: [phone]   Fax:  [phone]  Physical Therapy Treatment  Patient Details  Name: Alexis Rose MRN: [id number] Date of Birth: June 12, 1966 Referring Provider (PT): Traci Sermon, [place]   Encounter Date: 07/06/2020   PT End of Session - 07/06/20 1224    Visit Number 2    Number of Visits 8    Date for PT Re-Evaluation 08/24/20    Authorization Type [employer]    PT Start Time 1215    PT Stop Time 1300    PT Time Calculation (min) 45 min    Activity Tolerance Patient tolerated treatment well    Behavior During Therapy [facility] for tasks assessed/performed           Past Medical History:  Diagnosis Date  . Allergy   . Anxiety   . GERD (gastroesophageal reflux disease)   . Headache   . Pneumonia   . Wears glasses    reading    Past Surgical History:  Procedure Laterality Date  . CESAREAN SECTION    . CLOSED REDUCTION FINGER WITH PERCUTANEOUS PINNING Right 09/02/2014   Procedure: CLOSED REDUCTION PERCUTANEOUS PINNING RIGHT INDEX P-2 FRACTURE ;  Surgeon: Charlotte Crumb, MD;  Location: [place];  Service: Orthopedics;  Laterality: Right;  . THYROID LOBECTOMY Right 10/22/2015   Procedure: RIGHT THYROID LOBECTOMY;  Surgeon: Ralene Ok, MD;  Location: [place];  Service: General;  Laterality: Right;    There were no vitals filed for this visit.   Subjective Assessment - 07/06/20 1219    Subjective Feels a bit better than she did the first day.  Back is related to leaning forward and lifting.    Currently in Pain? No/denies              [facility] Adult PT Treatment/Exercise - 07/06/20 0001      Self-Care   Self-Care Other Self-Care Comments;Posture    Posture work related, donning shoe    Other Self-Care Comments  HEP, MRI , anatomy, dry needling, trigger points      Lumbar Exercises: Stretches   Standing Extension 5 reps    Prone  on Elbows Stretch 1 rep;60 seconds    Other Lumbar Stretch Exercise standing sidebending in doorway x 30 sec      Lumbar Exercises: Aerobic   UBE (Upper Arm Bike) pain in L lumbar , stopped      Lumbar Exercises: Supine   Clam 10 reps    Clam Limitations red band bilateral then unilateral    Bridge 10 reps    Bridge Limitations articulating      Lumbar Exercises: Prone   Other Prone Lumbar Exercises POE 1 min      Lumbar Exercises: Quadruped   Madcat/Old Horse 10 reps    Other Quadruped Lumbar Exercises childs pose with lateral bias x 3 each side      Manual Therapy   Soft tissue mobilization Rt quadratus lumborum and paraspinals in prone and then sidelying                    PT Short Term Goals - 06/29/20 1331      PT SHORT TERM GOAL #1   Title Patient will be I with initial HEP to progress with PT    Time 4    Period Weeks    Status New    Target Date 07/27/20  PT SHORT TERM GOAL #2   Title Patient will demonstrate lumbar mobility WFL and non-painful to improve ability to do household tasks    Time 4    Period Weeks    Status New    Target Date 07/27/20      PT SHORT TERM GOAL #3   Title Patient will be able to sit >/= 30 minutes without increase in pain to improve working capacity    Time 4    Period Weeks    Status New    Target Date 07/27/20             PT Long Term Goals - 06/29/20 1333      PT LONG TERM GOAL #1   Title Patient will be I with final HEP to maintain progress from PT    Time 8    Period Weeks    Status New    Target Date 08/24/20      PT LONG TERM GOAL #2   Title Patient will exhibit improved core/hip strength >/= 4+/5 MMT to improve ability to perform physical activity without pain    Time 8    Period Weeks    Status New    Target Date 08/24/20      PT LONG TERM GOAL #3   Title Patient will be able to sit and perform working tasks >/= 1 hour without increase in symptoms or needing break    Time 8    Period Weeks     Status New    Target Date 08/24/20      PT LONG TERM GOAL #4   Title Patient will return to prior exercise level to improve functional ability    Time 8    Period Weeks    Status New    Target Date 08/24/20                 Plan - 07/06/20 1225    Clinical Impression Statement Patient with Lt sided quadratus lumborum pain and spasm, which resolved with soft tissue mobilization. Discussed changing positions and adding in extension when pain increased/numbness in LE. Interested in dry needling but unable to get an appt for several weeks.    PT Treatment/Interventions ADLs/Self Care Home Management;Cryotherapy;Electrical Stimulation;Iontophoresis 4mg /ml Dexamethasone;Moist Heat;Traction;Ultrasound;Neuromuscular re-education;Balance training;Therapeutic exercise;Therapeutic activities;Functional mobility training;Stair training;Gait training;Patient/family education;Manual techniques;Dry needling;Passive range of motion;Taping;Spinal Manipulations;Joint Manipulations    PT Next Visit Plan manual , dry needling/manual for lumbar paraspinals, progress core and hip strengthening    PT Home Exercise Plan [id number]: cat camel, child's pose, bridge, clamshell with yellow, standing ext, QL, POE    Consulted and Agree with Plan of Care Patient           Patient will benefit from skilled therapeutic intervention in order to improve the following deficits and impairments:  Decreased range of motion,Postural dysfunction,Pain,Decreased activity tolerance,Decreased strength  Visit Diagnosis: Chronic bilateral low back pain, unspecified whether sciatica present  Muscle weakness (generalized)     Problem List Patient Active Problem List   Diagnosis Date Noted  . S/P partial thyroidectomy 10/22/2015    Clance Baquero 07/06/2020, 1:19 PM  [facility] [address] [place], [place], [address] Phone: [phone]   Fax:   [phone]  Name: Alexis Rose MRN: [id number] Date of Birth: 02-Mar-1966  Raeford Razor, PT 07/06/20 1:19 PM Phone: [phone] Fax: [phone]

## 2020-07-13 ENCOUNTER — Ambulatory Visit (HOSPITAL_COMMUNITY)
Admission: EM | Admit: 2020-07-13 | Discharge: 2020-07-13 | Disposition: A | Discharge status: Home / Self Care | Payer: 59 | Attending: Student | Admitting: Student

## 2020-07-13 ENCOUNTER — Encounter (HOSPITAL_COMMUNITY): Payer: Self-pay

## 2020-07-13 ENCOUNTER — Other Ambulatory Visit: Payer: Self-pay

## 2020-07-13 DIAGNOSIS — U071 COVID-19: Secondary | ICD-10-CM | POA: Insufficient documentation

## 2020-07-13 DIAGNOSIS — J069 Acute upper respiratory infection, unspecified: Secondary | ICD-10-CM | POA: Diagnosis present

## 2020-07-13 NOTE — ED Triage Notes (Signed)
Pt presents with runny nose and sore throat, HA on Sunday and has been taking cold and flu medication with some improvement of symptoms. Husband is with her and was exposed at work to covid,pt not vaccinated.

## 2020-07-13 NOTE — Discharge Instructions (Addendum)
-  Continue over the counter medications for management of your symptoms.  -For fevers/chills, body aches, headaches- use Tylenol and Ibuprofen. You can alternate these for maximum effect. Use up to 3000mg  Tylenol daily and 3200mg  Ibuprofen daily. Make sure to take ibuprofen with food. Check the bottle of ibuprofen/tylenol for specific dosage instructions.   We are currently awaiting result of your PCR covid-19 test. This typically comes back in 1-2 days. We'll call you if the result is positive. Otherwise, the result will be sent electronically to your MyChart. You can also call this clinic and ask for your result via telephone.   Please isolate at home while awaiting these results. If your test is positive for Covid-19, continue to isolate at home for 5 days if you have mild symptoms, or a total of 10 days from symptom onset if you have more severe symptoms. If you quarantine for a shorter period of time (i.e. 5 days), make sure to wear a mask until day 10 of symptoms. Treat your symptoms at home with OTC remedies like tylenol/ibuprofen, mucinex, nyquil, etc. Seek medical attention if you develop high fevers, chest pain, shortness of breath, ear pain, facial pain, etc. Make sure to get up and move around every 2-3 hours while convalescing to help prevent blood clots. Drink plenty of fluids, and rest as much as possible.

## 2020-07-13 NOTE — ED Provider Notes (Signed)
[place]    CSN: [id number] Arrival date & time: 07/13/20  1442      History   Chief Complaint Chief Complaint  Patient presents with  . Nasal Congestion  . Sore Throat    HPI Alexis Rose is a 55 y.o. female Presenting for URI symptoms for 3 days following husband's positive exposure at work. History of allergies, headaches, GERD, pneumonia. Presenting today with congestion, sore throat, headache. Has been taking OTC medications with some improvement in symptoms. Denies fevers/chills, n/v/d, shortness of breath, chest pain, cough, facial pain, teeth pain,, sore throat, loss of taste/smell, swollen lymph nodes, ear pain. Denies worst of life, thunderclap headache, weakness/sensation changes in arms/legs, vision changes, shortness of breath, chest pain/pressure, photophobia, phonophobia, n/v/d. Denies chest pain, shortness of breath, confusion, high fevers. Not vaccinated for covid.  HPI  Past Medical History:  Diagnosis Date  . Allergy   . Anxiety   . GERD (gastroesophageal reflux disease)   . Headache   . Pneumonia   . Wears glasses    reading    Patient Active Problem List   Diagnosis Date Noted  . S/P partial thyroidectomy 10/22/2015    Past Surgical History:  Procedure Laterality Date  . CESAREAN SECTION    . CLOSED REDUCTION FINGER WITH PERCUTANEOUS PINNING Right 09/02/2014   Procedure: CLOSED REDUCTION PERCUTANEOUS PINNING RIGHT INDEX P-2 FRACTURE ;  Surgeon: Charlotte Crumb, MD;  Location: [place];  Service: Orthopedics;  Laterality: Right;  . THYROID LOBECTOMY Right 10/22/2015   Procedure: RIGHT THYROID LOBECTOMY;  Surgeon: Ralene Ok, MD;  Location: [facility];  Service: General;  Laterality: Right;    OB History   No obstetric history on file.      Home Medications    Prior to Admission medications   Medication Sig Start Date End Date Taking? Authorizing Provider  FALMINA 0.1-20 MG-MCG tablet Take 1 tablet by mouth  daily. 09/25/15   [provider]  famotidine (PEPCID) 10 MG tablet You can buy this at any drug store and use as instructed once daily for heartburn/acid indigestion. 10/23/15   Earnstine Regal, PA-C  fexofenadine (ALLEGRA) 180 MG tablet Take 1 tablet (180 mg total) by mouth daily. For allergies Patient taking differently: Take 180 mg by mouth daily. 01/12/14   Le, Thao P, DO  fluticasone (FLONASE) 50 MCG/ACT nasal spray Place 2 sprays into both nostrils daily. 07/30/13   Rise Mu, PA-C  oxyCODONE (OXY IR/ROXICODONE) 5 MG immediate release tablet Take 1-2 tablets (5-10 mg total) by mouth every 4 (four) hours as needed for moderate pain. 10/23/15   Earnstine Regal, PA-C  oxyCODONE-acetaminophen (ROXICET) 5-325 MG tablet Take 1-2 tablets by mouth every 4 (four) hours as needed. 10/22/15   Ralene Ok, MD    Family History Family History  Problem Relation Age of Onset  . High Cholesterol Mother     Social History Social History   Tobacco Use  . Smoking status: Never Smoker  Substance Use Topics  . Alcohol use: No  . Drug use: No     Allergies   Patient has no known allergies.   Review of Systems Review of Systems  Constitutional: Negative for appetite change, chills and fever.  HENT: Positive for sore throat. Negative for congestion, ear pain, rhinorrhea, sinus pressure and sinus pain.   Eyes: Negative for redness and visual disturbance.  Respiratory: Negative for cough, chest tightness, shortness of breath and wheezing.   Cardiovascular: Negative for chest pain  and palpitations.  Gastrointestinal: Negative for abdominal pain, constipation, diarrhea, nausea and vomiting.  Genitourinary: Negative for dysuria, frequency and urgency.  Musculoskeletal: Positive for myalgias.  Neurological: Positive for headaches. Negative for dizziness and weakness.  Psychiatric/Behavioral: Negative for confusion.  All other systems reviewed and are negative.    Physical  Exam Triage Vital Signs ED Triage Vitals [07/13/20 1708]  Enc Vitals Group     BP      Pulse      Resp      Temp      Temp src      SpO2      Weight 109 lb (49.4 kg)     Height 5\' 5"  (1.651 m)     Head Circumference      Peak Flow      Pain Score 2     Pain Loc      Pain Edu?      Excl. in [place]?    No data found.  Updated Vital Signs BP (!) 137/94 (BP Location: Right Arm)   Pulse 87   Temp (!) 97.3 F (36.3 C) (Oral)   Ht 5\' 5"  (1.651 m)   Wt 109 lb (49.4 kg)   LMP 01/13/2016   SpO2 99%   BMI 18.14 kg/m   Visual Acuity Right Eye Distance:   Left Eye Distance:   Bilateral Distance:    Right Eye Near:   Left Eye Near:    Bilateral Near:     Physical Exam Vitals reviewed.  Constitutional:      General: She is not in acute distress.    Appearance: Normal appearance. She is not ill-appearing.  HENT:     Head: Normocephalic and atraumatic.     Right Ear: Hearing, tympanic membrane, ear canal and external ear normal. No swelling or tenderness. There is no impacted cerumen. No mastoid tenderness. Tympanic membrane is not perforated, erythematous, retracted or bulging.     Left Ear: Hearing, tympanic membrane, ear canal and external ear normal. No swelling or tenderness. There is no impacted cerumen. No mastoid tenderness. Tympanic membrane is not perforated, erythematous, retracted or bulging.     Nose:     Right Sinus: No maxillary sinus tenderness or frontal sinus tenderness.     Left Sinus: No maxillary sinus tenderness or frontal sinus tenderness.     Mouth/Throat:     Mouth: Mucous membranes are moist.     Pharynx: Uvula midline. No oropharyngeal exudate or posterior oropharyngeal erythema.     Tonsils: No tonsillar exudate.  Cardiovascular:     Rate and Rhythm: Normal rate and regular rhythm.     Heart sounds: Normal heart sounds.  Pulmonary:     Breath sounds: Normal breath sounds and air entry. No wheezing, rhonchi or rales.  Chest:     Chest wall: No  tenderness.  Abdominal:     General: Abdomen is flat. Bowel sounds are normal.     Tenderness: There is no abdominal tenderness. There is no guarding or rebound.  Lymphadenopathy:     Cervical: No cervical adenopathy.  Neurological:     General: No focal deficit present.     Mental Status: She is alert and oriented to person, place, and time.  Psychiatric:        Attention and Perception: Attention and perception normal.        Mood and Affect: Mood and affect normal.        Behavior: Behavior normal. Behavior is cooperative.  Thought Content: Thought content normal.        Judgment: Judgment normal.      UC Treatments / Results  Labs (all labs ordered are listed, but only abnormal results are displayed) Labs Reviewed  SARS CORONAVIRUS 2 (TAT 6-24 HRS)    EKG   Radiology No results found.  Procedures Procedures (including critical care time)  Medications Ordered in UC Medications - No data to display  Initial Impression / Assessment and Plan / UC Course  I have reviewed the triage vital signs and the nursing notes.  Pertinent labs & imaging results that were available during my care of the patient were reviewed by me and considered in my medical decision making (see chart for details).     Covid test sent today. Patient is not vaccinated for covid-19. Isolation precautions per CDC guidelines until negative result. Symptomatic relief with OTC Mucinex, Nyquil, etc. Return precautions- new/worsening fevers/chills, shortness of breath, chest pain, abd pain, etc.   Final Clinical Impressions(s) / UC Diagnoses   Final diagnoses:  Viral upper respiratory tract infection     Discharge Instructions     -Continue over the counter medications for management of your symptoms.  -For fevers/chills, body aches, headaches- use Tylenol and Ibuprofen. You can alternate these for maximum effect. Use up to 3000mg  Tylenol daily and 3200mg  Ibuprofen daily. Make sure to take  ibuprofen with food. Check the bottle of ibuprofen/tylenol for specific dosage instructions.   We are currently awaiting result of your PCR covid-19 test. This typically comes back in 1-2 days. We'll call you if the result is positive. Otherwise, the result will be sent electronically to your MyChart. You can also call this clinic and ask for your result via telephone.   Please isolate at home while awaiting these results. If your test is positive for Covid-19, continue to isolate at home for 5 days if you have mild symptoms, or a total of 10 days from symptom onset if you have more severe symptoms. If you quarantine for a shorter period of time (i.e. 5 days), make sure to wear a mask until day 10 of symptoms. Treat your symptoms at home with OTC remedies like tylenol/ibuprofen, mucinex, nyquil, etc. Seek medical attention if you develop high fevers, chest pain, shortness of breath, ear pain, facial pain, etc. Make sure to get up and move around every 2-3 hours while convalescing to help prevent blood clots. Drink plenty of fluids, and rest as much as possible.     ED Prescriptions    None     PDMP not reviewed this encounter.   Hazel Sams, PA-C 07/13/20 1735

## 2020-07-14 LAB — SARS CORONAVIRUS 2 (TAT 6-24 HRS): SARS Coronavirus 2: POSITIVE — AB

## 2020-07-15 ENCOUNTER — Ambulatory Visit: Payer: 59 | Admitting: Physical Therapy

## 2020-07-20 ENCOUNTER — Ambulatory Visit: Payer: 59 | Admitting: Physical Therapy

## 2020-07-27 ENCOUNTER — Ambulatory Visit: Payer: 59 | Attending: Physician Assistant | Admitting: Physical Therapy

## 2020-07-27 ENCOUNTER — Encounter: Payer: 59 | Admitting: Physical Therapy

## 2020-07-27 ENCOUNTER — Other Ambulatory Visit: Payer: Self-pay

## 2020-07-27 DIAGNOSIS — G8929 Other chronic pain: Secondary | ICD-10-CM | POA: Diagnosis present

## 2020-07-27 DIAGNOSIS — M6281 Muscle weakness (generalized): Secondary | ICD-10-CM | POA: Diagnosis present

## 2020-07-27 DIAGNOSIS — M545 Low back pain, unspecified: Secondary | ICD-10-CM | POA: Diagnosis present

## 2020-07-27 NOTE — Patient Instructions (Signed)
Trigger Point Dry Needling  . What is Trigger Point Dry Needling (DN)? o DN is a physical therapy technique used to treat muscle pain and dysfunction. Specifically, DN helps deactivate muscle trigger points (muscle knots).  o A thin filiform needle is used to penetrate the skin and stimulate the underlying trigger point. The goal is for a local twitch response (LTR) to occur and for the trigger point to relax. No medication of any kind is injected during the procedure.   . What Does Trigger Point Dry Needling Feel Like?  o The procedure feels different for each individual patient. Some patients report that they do not actually feel the needle enter the skin and overall the process is not painful. Very mild bleeding may occur. However, many patients feel a deep cramping in the muscle in which the needle was inserted. This is the local twitch response.   Marland Kitchen How Will I feel after the treatment? o Soreness is normal, and the onset of soreness may not occur for a few hours. Typically this soreness does not last longer than two days.  o Bruising is uncommon, however; ice can be used to decrease any possible bruising.  o In rare cases feeling tired or nauseous after the treatment is normal. In addition, your symptoms may get worse before they get better, this period will typically not last longer than 24 hours.   . What Can I do After My Treatment? o Increase your hydration by drinking more water for the next 24 hours. o You may place ice or heat on the areas treated that have become sore, however, do not use heat on inflamed or bruised areas. Heat often brings more relief post needling. o You can continue your regular activities, but vigorous activity is not recommended initially after the treatment for 24 hours. o DN is best combined with other physical therapy such as strengthening, stretching, and other therapies.   Explained by [place] interpreter

## 2020-07-27 NOTE — Therapy (Signed)
[place], [place], [address] Phone: [phone]   Fax:  [phone]  Physical Therapy Treatment  Patient Details  Name: Alexis Rose MRN: [id number] Date of Birth: 1966/03/09 Referring Provider (PT): Traci Sermon, [place]   Encounter Date: 07/27/2020   PT End of Session - 07/27/20 0946    Visit Number 2    Number of Visits 8    Date for PT Re-Evaluation 08/24/20    Authorization Type [employer]    PT Start Time [phone]    PT Stop Time 1028    PT Time Calculation (min) 53 min    Activity Tolerance Patient tolerated treatment well    Behavior During Therapy [facility] for tasks assessed/performed           Past Medical History:  Diagnosis Date  . Allergy   . Anxiety   . GERD (gastroesophageal reflux disease)   . Headache   . Pneumonia   . Wears glasses    reading    Past Surgical History:  Procedure Laterality Date  . CESAREAN SECTION    . CLOSED REDUCTION FINGER WITH PERCUTANEOUS PINNING Right 09/02/2014   Procedure: CLOSED REDUCTION PERCUTANEOUS PINNING RIGHT INDEX P-2 FRACTURE ;  Surgeon: Charlotte Crumb, MD;  Location: [place];  Service: Orthopedics;  Laterality: Right;  . THYROID LOBECTOMY Right 10/22/2015   Procedure: RIGHT THYROID LOBECTOMY;  Surgeon: Ralene Ok, MD;  Location: [place];  Service: General;  Laterality: Right;    There were no vitals filed for this visit.   Subjective Assessment - 07/27/20 0939    Subjective My back hurts around the waistline.  She is interested in TPDN    Patient is accompained by: Interpreter   Alexis Rose  [phone]   Limitations Lifting    How long can you sit comfortably? I can sit for 30 minutes    How long can you stand comfortably? I can stand about 30 to 40 minutes    How long can you walk comfortably? No limitation    Diagnostic tests X-ray, MRI    Patient Stated Goals Alleviate low back pain, learn ways to improve pain  and get back to exercising    Currently in Pain? Yes    Pain Score 5     Pain Location Back                             [facility] Adult PT Treatment/Exercise - 07/27/20 0001      Exercises   Exercises Lumbar      Lumbar Exercises: Stretches   Other Lumbar Stretch Exercise standing sidebending in doorway x 30 sec   Pt reported that this ex hurt last time, proper execution today     Lumbar Exercises: Supine   Clam 10 reps    Clam Limitations red band bilateral then unilateral    Bridge 10 reps    Bridge Limitations articulating      Lumbar Exercises: Prone   Other Prone Lumbar Exercises POE 1 min      Lumbar Exercises: Quadruped   Other Quadruped Lumbar Exercises childs pose forward30 sec and then R and L 30 sec each      Modalities   Modalities Moist Heat      Moist Heat Therapy   Number Minutes Moist Heat 15 Minutes    Moist Heat Location Lumbar Spine   QL     Manual  Therapy   Manual Therapy Joint mobilization;Soft tissue mobilization    Manual therapy comments skilled palpation for TPDN    Joint Mobilization CPA to lower lumber region grade III-IV    Soft tissue mobilization bil QL in sideling STW            Trigger Point Dry Needling - 07/27/20 0001    Consent Given? Yes    Education Handout Provided Yes   explained by interpreter   Muscles Treated Back/Hip Quadratus lumborum    Dry Needling Comments 75 mm .25 gage  .40 mm    Quadratus Lumborum Response Twitch response elicited;Palpable increased muscle length                  PT Short Term Goals - 07/27/20 0946      PT SHORT TERM GOAL #1   Title Patient will be I with initial HEP to progress with PT    Baseline did not do exercises b/c of COVID but she is I with initial HEP    Time 4    Period Weeks    Status Achieved      PT SHORT TERM GOAL #2   Title Patient will demonstrate lumbar mobility WFL and non-painful to improve ability to do household tasks    Baseline Pt able to  bend to floor    Time 4    Period Weeks    Status Achieved    Target Date 07/27/20      PT SHORT TERM GOAL #3   Title Patient will be able to sit >/= 30 minutes without increase in pain to improve working capacity    Baseline Pt reports being able to sit for 30 minutes    Time 4    Period Weeks    Status Achieved             PT Long Term Goals - 07/27/20 0950      PT LONG TERM GOAL #1   Title Patient will be I with final HEP to maintain progress from PT    Time 8    Period Weeks    Status On-going    Target Date 08/24/20      PT LONG TERM GOAL #2   Title Patient will exhibit improved core/hip strength >/= 4+/5 MMT to improve ability to perform physical activity without pain    Time 8    Period Weeks    Status On-going    Target Date 08/24/20      PT LONG TERM GOAL #3   Title Patient will be able to sit and perform working tasks >/= 1 hour without increase in symptoms or needing break    Baseline 30 minutes now    Time 8    Period Weeks    Status On-going    Target Date 08/24/20      PT LONG TERM GOAL #4   Title Patient will return to prior exercise level to improve functional ability    Baseline Pt with COVID and unable to exercise but nowable to exercise    Time 8    Period Weeks    Status On-going    Target Date 08/24/20                 Plan - 07/27/20 1024    Clinical Impression Statement Ms Ngyuen demonstrates ability to touch floor with [facility] of Lumbar but complains of 5/10 bil QL muscles.  Pt consents to TPDN and and was closley  monitored throughout session.  Pt did not have pain after TPDN and was able to perform exercises.  Pt achieved all STG this session.  Will cont POC and progress strength.    Personal Factors and Comorbidities Time since onset of injury/illness/exacerbation    Examination-Activity Limitations Sit;Lift    Examination-Participation Restrictions Occupation    PT Frequency 1x / week    PT Duration 8 weeks    PT  Treatment/Interventions ADLs/Self Care Home Management;Cryotherapy;Electrical Stimulation;Iontophoresis 4mg /ml Dexamethasone;Moist Heat;Traction;Ultrasound;Neuromuscular re-education;Balance training;Therapeutic exercise;Therapeutic activities;Functional mobility training;Stair training;Gait training;Patient/family education;Manual techniques;Dry needling;Passive range of motion;Taping;Spinal Manipulations;Joint Manipulations    PT Next Visit Plan manual , dry needling/manual for lumbar paraspinals, progress core and hip strengthening    PT Home Exercise Plan [id number]: cat camel, child's pose, bridge, clamshell with yellow, standing ext, QL, POE    Consulted and Agree with Plan of Care Patient           Patient will benefit from skilled therapeutic intervention in order to improve the following deficits and impairments:  Decreased range of motion,Postural dysfunction,Pain,Decreased activity tolerance,Decreased strength  Visit Diagnosis: Chronic bilateral low back pain, unspecified whether sciatica present  Muscle weakness (generalized)     Problem List Patient Active Problem List   Diagnosis Date Noted  . S/P partial thyroidectomy 10/22/2015   Voncille Lo, PT, [place] Certified Exercise Expert for the Aging Adult  07/27/20 10:28 AM Phone: [phone] Fax: [facility] [address] [place], [place], [address] Phone: [phone]   Fax:  [phone]  Name: Alexis Rose MRN: [id number] Date of Birth: [birth year]

## 2020-08-10 ENCOUNTER — Encounter: Payer: 59 | Admitting: Physical Therapy

## 2020-08-17 ENCOUNTER — Encounter: Payer: 59 | Admitting: Physical Therapy

## 2020-08-25 ENCOUNTER — Encounter: Payer: Self-pay | Admitting: Physical Therapy

## 2020-08-25 ENCOUNTER — Ambulatory Visit: Payer: 59 | Attending: Physician Assistant | Admitting: Physical Therapy

## 2020-08-25 ENCOUNTER — Other Ambulatory Visit: Payer: Self-pay

## 2020-08-25 DIAGNOSIS — M6281 Muscle weakness (generalized): Secondary | ICD-10-CM | POA: Diagnosis present

## 2020-08-25 DIAGNOSIS — G8929 Other chronic pain: Secondary | ICD-10-CM | POA: Diagnosis present

## 2020-08-25 DIAGNOSIS — M545 Low back pain, unspecified: Secondary | ICD-10-CM | POA: Diagnosis not present

## 2020-08-25 NOTE — Patient Instructions (Addendum)
Access Code: [email or web address] URL: [email or web address] Date: 08/26/2020 Prepared by: Hilda Blades  Exercises Supine Lower Trunk Rotation - 1-2 x daily - 7 x weekly - 10 reps - 10 hold Supine Bridge with Spinal Articulation - 1-2 x daily - 7 x weekly - 10 reps - 5 hold Child's Pose with Sidebending - 1-2 x daily - 7 x weekly - 2 reps - 30 hold Cat-Camel - 1-2 x daily - 7 x weekly - 10 reps - 3 hold Tail Wag - 1-2 x daily - 7 x weekly - 10 reps - 3 hold Bird Dog - 1-2 x daily - 7 x weekly - 10 reps - 5 hold Standing Quadratus Lumborum Stretch with Doorway - 1-2 x daily - 7 x weekly - 2 reps - 30 hold Standing Quadratus Lumborum Mobilization with Small Ball on Wall

## 2020-08-26 NOTE — Therapy (Signed)
[place], [place], [address] Phone: [phone]   Fax:  [phone]  Physical Therapy Treatment / ERO   Patient Details  Name: Alexis Rose MRN: [id number] Date of Birth: [birth year] Referring Provider (PT): Traci Sermon, [place]   Encounter Date: 08/25/2020   PT End of Session - 08/25/20 2725    Visit Number 4    Number of Visits 10    Date for PT Re-Evaluation 10/06/20    Authorization Type [employer]    PT Start Time 1700    PT Stop Time 1740    PT Time Calculation (min) 40 min    Activity Tolerance Patient tolerated treatment well    Behavior During Therapy [facility] for tasks assessed/performed           Past Medical History:  Diagnosis Date  . Allergy   . Anxiety   . GERD (gastroesophageal reflux disease)   . Headache   . Pneumonia   . Wears glasses    reading    Past Surgical History:  Procedure Laterality Date  . CESAREAN SECTION    . CLOSED REDUCTION FINGER WITH PERCUTANEOUS PINNING Right 09/02/2014   Procedure: CLOSED REDUCTION PERCUTANEOUS PINNING RIGHT INDEX P-2 FRACTURE ;  Surgeon: Charlotte Crumb, MD;  Location: [place];  Service: Orthopedics;  Laterality: Right;  . THYROID LOBECTOMY Right 10/22/2015   Procedure: RIGHT THYROID LOBECTOMY;  Surgeon: Ralene Ok, MD;  Location: [place];  Service: General;  Laterality: Right;    There were no vitals filed for this visit.   Subjective Assessment - 08/25/20 1705    Subjective Patient reports that her back hurts if she sits too long, will have numbness and tingling to her butt. Patient reports that the dry needling caused more tingling in lower back. Currently there is a sore/tight area more on the left side.    Limitations Sitting    How long can you sit comfortably? 30 minutes    Patient Stated Goals Alleviate low back pain, learn ways to improve pain and get back to exercising    Currently in Pain? Yes     Pain Score 3     Pain Location Back    Pain Orientation Lower    Pain Descriptors / Indicators Aching;Tingling;Tightness;Sore    Pain Type Chronic pain    Pain Onset More than a month ago    Pain Frequency Intermittent    Aggravating Factors  Sitting extended periods    Pain Relieving Factors Massaging the area              [facility] PT Assessment - 08/26/20 0001      Assessment   Medical Diagnosis Low back pain    Referring Provider (PT) Traci Sermon, PA-C    Onset Date/Surgical Date 02/05/20      Precautions   Precautions None      Restrictions   Weight Bearing Restrictions No      Balance Screen   Has the patient fallen in the past 6 months No      Prior Function   Level of Independence Independent      Observation/Other Assessments   Focus on Therapeutic Outcomes (FOTO)  NA - language barrier      AROM   Overall AROM Comments Lumbar AROM grossly WFL - tightness reported with flexion and right side bend      PROM   Overall PROM Comments Hip PROM grossly WFL and non-painful  Strength   Overall Strength Comments Core strength grossly 4/5, continues with difficulty with lumbopelvic control                         [facility] Adult PT Treatment/Exercise - 08/26/20 0001      Self-Care   Self-Care Other Self-Care Comments;Posture    Posture Seated posture    Other Self-Care Comments  POC, HEP      Exercises   Exercises Lumbar      Lumbar Exercises: Stretches   Lower Trunk Rotation 5 reps;10 seconds    Lower Trunk Rotation Limitations legs crossed    Other Lumbar Stretch Exercise Child's pose lateral 2 x 30 sec each      Lumbar Exercises: Aerobic   Nustep L5 x 5 min with LE only      Lumbar Exercises: Supine   Bridge 10 reps   2 sets   Bridge Limitations articulating      Lumbar Exercises: Quadruped   Madcat/Old Horse 10 reps    Opposite Arm/Leg Raise 10 reps;5 seconds   2 sets   Other Quadruped Lumbar Exercises Tail wag 2 x 10                   PT Education - 08/25/20 1646    Education Details POC, HEP update    Person(s) Educated Patient    Methods Explanation;Demonstration;Verbal cues;Handout    Comprehension Verbalized understanding;Need further instruction;Returned demonstration;Verbal cues required            PT Short Term Goals - 07/27/20 0946      PT SHORT TERM GOAL #1   Title Patient will be I with initial HEP to progress with PT    Baseline did not do exercises b/c of COVID but she is I with initial HEP    Time 4    Period Weeks    Status Achieved      PT SHORT TERM GOAL #2   Title Patient will demonstrate lumbar mobility WFL and non-painful to improve ability to do household tasks    Baseline Pt able to bend to floor    Time 4    Period Weeks    Status Achieved    Target Date 07/27/20      PT SHORT TERM GOAL #3   Title Patient will be able to sit >/= 30 minutes without increase in pain to improve working capacity    Baseline Pt reports being able to sit for 30 minutes    Time 4    Period Weeks    Status Achieved             PT Long Term Goals - 08/25/20 1748      PT LONG TERM GOAL #1   Title Patient will be I with final HEP to maintain progress from PT    Baseline Continueing to progress    Time 6    Period Weeks    Status On-going    Target Date 10/06/20      PT LONG TERM GOAL #2   Title Patient will exhibit improved core/hip strength >/= 4+/5 MMT to improve ability to perform physical activity without pain    Baseline Strength grossly 4/5 MMT    Time 6    Period Weeks    Status On-going    Target Date 10/06/20      PT LONG TERM GOAL #3   Title Patient will be able to sit and  perform working tasks >/= 1 hour without increase in symptoms or needing break    Baseline 30 minutes    Time 6    Period Weeks    Status On-going    Target Date 10/06/20      PT LONG TERM GOAL #4   Title Patient will return to prior exercise level to improve functional ability     Baseline Patient reports she is not exercising    Time 6    Period Weeks    Status On-going    Target Date 10/06/20                 Plan - 08/25/20 1648    Clinical Impression Statement Patient tolerated therapy well with no adverse effects. She is continuing to progress toward long term goals but reports persistent paint/tightness with sitting extended periods especially while at work. Therapy continues to focus on progressing strength and reducing muscular tightness. No radicular symptoms reported this visit. Updated HEP with good tolerance. Cures required for lumbopelvic control and using ball for SMFR as needed while sitting extended periods. Patient would benefit from continued skilled PT to progress her mobility and strength in order to reduce pain and maximize her functional ability with sitting longer periods.    PT Treatment/Interventions ADLs/Self Care Home Management;Cryotherapy;Electrical Stimulation;Iontophoresis 4mg /ml Dexamethasone;Moist Heat;Traction;Ultrasound;Neuromuscular re-education;Balance training;Therapeutic exercise;Therapeutic activities;Functional mobility training;Stair training;Gait training;Patient/family education;Manual techniques;Dry needling;Passive range of motion;Taping;Spinal Manipulations;Joint Manipulations    PT Next Visit Plan Review HEP and progress PRN, progress lumbopelvic/core strength and control (trial reformer?), manual/stretching PRN    PT Home Exercise Plan [id number]    Consulted and Agree with Plan of Care Patient           Patient will benefit from skilled therapeutic intervention in order to improve the following deficits and impairments:  Decreased range of motion,Postural dysfunction,Pain,Decreased activity tolerance,Decreased strength  Visit Diagnosis: Chronic bilateral low back pain, unspecified whether sciatica present  Muscle weakness (generalized)     Problem List Patient Active Problem List   Diagnosis Date Noted  .  S/P partial thyroidectomy 10/22/2015    Hilda Blades, PT, DPT, LAT, ATC 08/26/20  8:27 AM Phone: [phone] Fax: [facility] [address] [place], [place], [address] Phone: [phone]   Fax:  [phone]  Name: CRISTAL QADIR MRN: [id number] Date of Birth: 12-02-1965

## 2020-08-30 ENCOUNTER — Other Ambulatory Visit: Payer: Self-pay

## 2020-08-30 ENCOUNTER — Encounter: Payer: Self-pay | Admitting: Physical Therapy

## 2020-08-30 ENCOUNTER — Ambulatory Visit: Payer: 59 | Admitting: Physical Therapy

## 2020-08-30 DIAGNOSIS — M545 Low back pain, unspecified: Secondary | ICD-10-CM

## 2020-08-30 DIAGNOSIS — M6281 Muscle weakness (generalized): Secondary | ICD-10-CM

## 2020-08-30 DIAGNOSIS — G8929 Other chronic pain: Secondary | ICD-10-CM

## 2020-08-30 NOTE — Therapy (Signed)
[place], [place], [address] Phone: [phone]   Fax:  [phone]  Physical Therapy Treatment  Patient Details  Name: Alexis Rose MRN: [id number] Date of Birth: 1966/04/02 Referring Provider (PT): Traci Sermon, [place]   Encounter Date: 08/30/2020   PT End of Session - 08/30/20 1435    Visit Number 5    Number of Visits 10    Date for PT Re-Evaluation 10/06/20    Authorization Type [employer]    PT Start Time 1450    PT Stop Time 1530    PT Time Calculation (min) 40 min    Activity Tolerance Patient tolerated treatment well    Behavior During Therapy [facility] for tasks assessed/performed           Past Medical History:  Diagnosis Date  . Allergy   . Anxiety   . GERD (gastroesophageal reflux disease)   . Headache   . Pneumonia   . Wears glasses    reading    Past Surgical History:  Procedure Laterality Date  . CESAREAN SECTION    . CLOSED REDUCTION FINGER WITH PERCUTANEOUS PINNING Right 09/02/2014   Procedure: CLOSED REDUCTION PERCUTANEOUS PINNING RIGHT INDEX P-2 FRACTURE ;  Surgeon: Charlotte Crumb, MD;  Location: [place];  Service: Orthopedics;  Laterality: Right;  . THYROID LOBECTOMY Right 10/22/2015   Procedure: RIGHT THYROID LOBECTOMY;  Surgeon: Ralene Ok, MD;  Location: [place];  Service: General;  Laterality: Right;    There were no vitals filed for this visit.   Subjective Assessment - 08/30/20 1453    Subjective Patient reports she is feeling fine, exercises help. The tennis ball has helped some for massaging. Having some lower back pain, still on the left side but also more in the center. Patient also notes she sometimes gets woken by the pain, feels sore and tingling in the lower back, then has to stay up for a while before she can go back to sleep.    Patient is accompained by: Interpreter   [place] [phone]   Patient Stated Goals Alleviate low back pain,  learn ways to improve pain and get back to exercising    Currently in Pain? Yes    Pain Score 3     Pain Location Back    Pain Orientation Lower    Pain Descriptors / Indicators Aching;Tightness    Pain Type Chronic pain    Pain Onset More than a month ago    Pain Frequency Intermittent              [facility] PT Assessment - 08/30/20 0001      AROM   Overall AROM Comments Patient reporting increased midline low back pain with extension this visit, no pain with flexion      Palpation   Spinal mobility Increased localized midline discomfort with L5-S1 CPA                         [facility] Adult PT Treatment/Exercise - 08/30/20 0001      Exercises   Exercises Lumbar      Lumbar Exercises: Stretches   Lower Trunk Rotation 5 reps    Lower Trunk Rotation Limitations 5 sec legs crossed    Other Lumbar Stretch Exercise Child's pose lateral  x 20 sec each      Lumbar Exercises: Aerobic   Nustep L5 x 5 min with LE/UE  Lumbar Exercises: Standing   Lifting 10 reps    Lifting Limitations 15# from 8" box, max cueing for proper hip hinge technique and maintaining neutral lumbar spine      Lumbar Exercises: Supine   Dead Bug 10 reps   2 sets   Bridge 10 reps   5 sec hold   Bridge Limitations articulating    Bridge with March 5 reps   2 sets     Lumbar Exercises: Quadruped   Opposite Arm/Leg Raise 10 reps;5 seconds   2 sets     Manual Therapy   Manual Therapy Joint mobilization    Joint Mobilization CPA to lower lumber region grade III-IV                  PT Education - 08/30/20 1435    Education Details HEP update    Person(s) Educated Patient    Methods Explanation;Demonstration;Verbal cues;Handout    Comprehension Verbalized understanding;Need further instruction;Returned demonstration;Verbal cues required            PT Short Term Goals - 07/27/20 0946      PT SHORT TERM GOAL #1   Title Patient will be I with initial HEP to progress with PT     Baseline did not do exercises b/c of COVID but she is I with initial HEP    Time 4    Period Weeks    Status Achieved      PT SHORT TERM GOAL #2   Title Patient will demonstrate lumbar mobility WFL and non-painful to improve ability to do household tasks    Baseline Pt able to bend to floor    Time 4    Period Weeks    Status Achieved    Target Date 07/27/20      PT SHORT TERM GOAL #3   Title Patient will be able to sit >/= 30 minutes without increase in pain to improve working capacity    Baseline Pt reports being able to sit for 30 minutes    Time 4    Period Weeks    Status Achieved             PT Long Term Goals - 08/25/20 1748      PT LONG TERM GOAL #1   Title Patient will be I with final HEP to maintain progress from PT    Baseline Continueing to progress    Time 6    Period Weeks    Status On-going    Target Date 10/06/20      PT LONG TERM GOAL #2   Title Patient will exhibit improved core/hip strength >/= 4+/5 MMT to improve ability to perform physical activity without pain    Baseline Strength grossly 4/5 MMT    Time 6    Period Weeks    Status On-going    Target Date 10/06/20      PT LONG TERM GOAL #3   Title Patient will be able to sit and perform working tasks >/= 1 hour without increase in symptoms or needing break    Baseline 30 minutes    Time 6    Period Weeks    Status On-going    Target Date 10/06/20      PT LONG TERM GOAL #4   Title Patient will return to prior exercise level to improve functional ability    Baseline Patient reports she is not exercising    Time 6    Period Weeks  Status On-going    Target Date 10/06/20                 Plan - 08/30/20 1436    Clinical Impression Statement Patient tolerated therapy well with no adverse effects. She arrived reporting more midline lower back pain this visit especially with extension and this improved following manual therapy, although patient continued to report soreness lower  center lumbar region. Patient was able to tolerate progressions in core strengthening this visit and incorporated lifting mechanics this visit. She required constant max cueing for lifting mechanics to avoid general spinal flexion when lifting. Patient would benefit from continued skilled PT to progress her mobility and strength in order to reduce pain and maximize her functional ability with sitting longer periods.    PT Treatment/Interventions ADLs/Self Care Home Management;Cryotherapy;Electrical Stimulation;Iontophoresis 4mg /ml Dexamethasone;Moist Heat;Traction;Ultrasound;Neuromuscular re-education;Balance training;Therapeutic exercise;Therapeutic activities;Functional mobility training;Stair training;Gait training;Patient/family education;Manual techniques;Dry needling;Passive range of motion;Taping;Spinal Manipulations;Joint Manipulations    PT Next Visit Plan Review HEP and progress PRN, progress lumbopelvic/core strength and control (trial reformer?), lifting mechanics and progress lifting, manual/stretching PRN    PT Home Exercise Plan [id number]    Consulted and Agree with Plan of Care Patient           Patient will benefit from skilled therapeutic intervention in order to improve the following deficits and impairments:  Decreased range of motion,Postural dysfunction,Pain,Decreased activity tolerance,Decreased strength  Visit Diagnosis: Chronic bilateral low back pain, unspecified whether sciatica present  Muscle weakness (generalized)     Problem List Patient Active Problem List   Diagnosis Date Noted  . S/P partial thyroidectomy 10/22/2015    Hilda Blades, PT, DPT, LAT, ATC 08/30/20  3:47 PM Phone: [phone] Fax: [facility] [address] [place], [place], [address] Phone: [phone]   Fax:  [phone]  Name: Alexis Rose MRN: [id number] Date of Birth: 01/25/1966

## 2020-08-30 NOTE — Patient Instructions (Signed)
Access Code: [email or web address] URL: [email or web address] Date: 08/30/2020 Prepared by: Hilda Blades  Exercises Supine Lower Trunk Rotation - 1-2 x daily - 7 x weekly - 10 reps - 10 hold Marching Bridge - 1 x daily - 7 x weekly - 2 sets - 10 reps Supine Dead Bug with Leg Extension - 1 x daily - 7 x weekly - 2 sets - 10 reps Child's Pose with Sidebending - 1-2 x daily - 7 x weekly - 2 reps - 30 hold Cat-Camel - 1-2 x daily - 7 x weekly - 10 reps - 3 hold Tail Wag - 1-2 x daily - 7 x weekly - 10 reps - 3 hold Bird Dog - 1 x daily - 7 x weekly - 10 reps - 5 hold Standing Quadratus Lumborum Stretch with Doorway - 1-2 x daily - 7 x weekly - 2 reps - 30 hold Standing Quadratus Lumborum Mobilization with Small Ball on Wall

## 2020-09-10 ENCOUNTER — Encounter: Payer: 59 | Admitting: Physical Therapy

## 2020-09-15 ENCOUNTER — Ambulatory Visit: Payer: 59 | Admitting: Physical Therapy

## 2020-09-29 ENCOUNTER — Encounter: Payer: Self-pay | Admitting: Physical Therapy

## 2020-09-29 ENCOUNTER — Ambulatory Visit: Payer: 59 | Attending: Physician Assistant | Admitting: Physical Therapy

## 2020-09-29 ENCOUNTER — Other Ambulatory Visit: Payer: Self-pay

## 2020-09-29 DIAGNOSIS — G8929 Other chronic pain: Secondary | ICD-10-CM | POA: Diagnosis present

## 2020-09-29 DIAGNOSIS — M545 Low back pain, unspecified: Secondary | ICD-10-CM | POA: Insufficient documentation

## 2020-09-29 DIAGNOSIS — M6281 Muscle weakness (generalized): Secondary | ICD-10-CM | POA: Diagnosis present

## 2020-09-29 NOTE — Therapy (Signed)
[place], [place], [address] Phone: [phone]   Fax:  [phone]  Physical Therapy Treatment  Patient Details  Name: Alexis Rose MRN: [id number] Date of Birth: [birth year] Referring Provider (PT): Traci Sermon, [place]   Encounter Date: 09/29/2020   PT End of Session - 09/29/20 1442    Visit Number 6    Number of Visits 10    Date for PT Re-Evaluation 10/06/20    Authorization Type [employer]    PT Start Time 1148    PT Stop Time 1233    PT Time Calculation (min) 45 min    Activity Tolerance Patient tolerated treatment well    Behavior During Therapy [facility] for tasks assessed/performed           Past Medical History:  Diagnosis Date  . Allergy   . Anxiety   . GERD (gastroesophageal reflux disease)   . Headache   . Pneumonia   . Wears glasses    reading    Past Surgical History:  Procedure Laterality Date  . CESAREAN SECTION    . CLOSED REDUCTION FINGER WITH PERCUTANEOUS PINNING Right 09/02/2014   Procedure: CLOSED REDUCTION PERCUTANEOUS PINNING RIGHT INDEX P-2 FRACTURE ;  Surgeon: Charlotte Crumb, MD;  Location: [place];  Service: Orthopedics;  Laterality: Right;  . THYROID LOBECTOMY Right 10/22/2015   Procedure: RIGHT THYROID LOBECTOMY;  Surgeon: Ralene Ok, MD;  Location: [place];  Service: General;  Laterality: Right;    There were no vitals filed for this visit.   Subjective Assessment - 09/29/20 1437    Subjective Pt. returns, not seen since 08/30/20 with last couple of visits cancelled. She reports had been sore after last treatment with dry needlng but agrees to try again today. 3/10 pain today more central into lower lumbar and SI region left>right. Though still pain overall reports feeling better from baseline after therapy and HEP to date.    Currently in Pain? Yes    Pain Score 3     Pain Location Back    Pain Orientation Lower    Pain Descriptors /  Indicators Aching;Tightness    Pain Type Chronic pain    Pain Onset More than a month ago    Pain Frequency Intermittent    Aggravating Factors  prolonged sitting    Pain Relieving Factors STM                             [facility] Adult PT Treatment/Exercise - 09/29/20 0001      Lumbar Exercises: Seated   Other Seated Lumbar Exercises HEP instruction and brief practice seated flexion wih figure 4 position left leg for posterior innominate rotation directional preference, also demo/instruction standing variation with left foot on chair with trunk flexion      Lumbar Exercises: Supine   Pelvic Tilt 15 reps    Bridge 15 reps      Manual Therapy   Joint Mobilization AP mobilizations to ASIS-unilat. on left and bilat. grade I-III    Soft tissue mobilization brief low lumbar STM            Trigger Point Dry Needling - 09/29/20 0001    Consent Given? Yes    Muscles Treated Back/Hip Erector spinae;Lumbar multifidi    Dry Needling Comments needling to bilat. lower lumbar longissimus L4-5 region and multifidi L4-S1 region with 32 gauge 50 mm needles  Electrical Stimulation Performed with Dry Needling Yes    E-stim with Dry Needling Details TENS 2 pps x 10 minutes                PT Education - 09/29/20 1441    Education Details POC, dry needling, HEP    Person(s) Educated Patient    Methods Explanation;Demonstration;Verbal cues    Comprehension Verbalized understanding;Returned demonstration            PT Short Term Goals - 07/27/20 0946      PT SHORT TERM GOAL #1   Title Patient will be I with initial HEP to progress with PT    Baseline did not do exercises b/c of COVID but she is I with initial HEP    Time 4    Period Weeks    Status Achieved      PT SHORT TERM GOAL #2   Title Patient will demonstrate lumbar mobility WFL and non-painful to improve ability to do household tasks    Baseline Pt able to bend to floor    Time 4    Period Weeks     Status Achieved    Target Date 07/27/20      PT SHORT TERM GOAL #3   Title Patient will be able to sit >/= 30 minutes without increase in pain to improve working capacity    Baseline Pt reports being able to sit for 30 minutes    Time 4    Period Weeks    Status Achieved             PT Long Term Goals - 08/25/20 1748      PT LONG TERM GOAL #1   Title Patient will be I with final HEP to maintain progress from PT    Baseline Continueing to progress    Time 6    Period Weeks    Status On-going    Target Date 10/06/20      PT LONG TERM GOAL #2   Title Patient will exhibit improved core/hip strength >/= 4+/5 MMT to improve ability to perform physical activity without pain    Baseline Strength grossly 4/5 MMT    Time 6    Period Weeks    Status On-going    Target Date 10/06/20      PT LONG TERM GOAL #3   Title Patient will be able to sit and perform working tasks >/= 1 hour without increase in symptoms or needing break    Baseline 30 minutes    Time 6    Period Weeks    Status On-going    Target Date 10/06/20      PT LONG TERM GOAL #4   Title Patient will return to prior exercise level to improve functional ability    Baseline Patient reports she is not exercising    Time 6    Period Weeks    Status On-going    Target Date 10/06/20                 Plan - 09/29/20 1442    Clinical Impression Statement Pt. returns after absence from therapy as noted in subjective overall still improving but still with tightness/pain as noted in subjective. Pt. had reported significant soreness after previous trial dry needling-she agreed to trial more passive technique with needle retention as noted per flowsheet which was well-tolerated today. Checked for directional preference regarding SI pain and she did note relief with manual posterior innominate rotation  with AP mobs to ASIS to addressed with manual therapy and demonstrated technique for home variations with standing flexion  with left foot on step vs. seated trunk flexion with figure 4 position. Tentatively plan 1-2 more sessions then d/c to HEP pending progress.    Personal Factors and Comorbidities Time since onset of injury/illness/exacerbation    Examination-Activity Limitations Sit;Lift    Examination-Participation Restrictions Occupation    Stability/Clinical Decision Making Stable/Uncomplicated    Clinical Decision Making Low    Rehab Potential Good    PT Frequency 1x / week    PT Duration 8 weeks    PT Treatment/Interventions ADLs/Self Care Home Management;Cryotherapy;Electrical Stimulation;Iontophoresis 4mg /ml Dexamethasone;Moist Heat;Traction;Ultrasound;Neuromuscular re-education;Balance training;Therapeutic exercise;Therapeutic activities;Functional mobility training;Stair training;Gait training;Patient/family education;Manual techniques;Dry needling;Passive range of motion;Taping;Spinal Manipulations;Joint Manipulations    PT Next Visit Plan Continue dry needling (prefers estim use/less needle manipulation), as needed APs to left>right ASIS, progress lumbopelvic stabilization as tolerated and stretches prn, ERO vs. d/c pending status    PT Home Exercise Plan [id number]    Consulted and Agree with Plan of Care Patient           Patient will benefit from skilled therapeutic intervention in order to improve the following deficits and impairments:  Decreased range of motion,Postural dysfunction,Pain,Decreased activity tolerance,Decreased strength  Visit Diagnosis: Chronic bilateral low back pain, unspecified whether sciatica present  Muscle weakness (generalized)     Problem List Patient Active Problem List   Diagnosis Date Noted  . S/P partial thyroidectomy 10/22/2015    Beaulah Dinning, PT, DPT 09/29/20 2:48 PM  [facility] [address] [place], [place], [address] Phone: [phone]   Fax:  [phone]  Name: Alexis Rose MRN:  [id number] Date of Birth: [birth year]

## 2020-10-27 ENCOUNTER — Other Ambulatory Visit: Payer: Self-pay

## 2020-10-27 ENCOUNTER — Encounter: Payer: Self-pay | Admitting: Physical Therapy

## 2020-10-27 ENCOUNTER — Ambulatory Visit: Payer: 59 | Attending: Physician Assistant | Admitting: Physical Therapy

## 2020-10-27 DIAGNOSIS — G8929 Other chronic pain: Secondary | ICD-10-CM | POA: Insufficient documentation

## 2020-10-27 DIAGNOSIS — M6281 Muscle weakness (generalized): Secondary | ICD-10-CM | POA: Diagnosis present

## 2020-10-27 DIAGNOSIS — M545 Low back pain, unspecified: Secondary | ICD-10-CM | POA: Insufficient documentation

## 2020-10-27 NOTE — Therapy (Signed)
[place], [place], [address] Phone: [phone]   Fax:  [phone]  Physical Therapy Treatment / Re-certification  Patient Details  Name: Alexis Rose MRN: [id number] Date of Birth: [birth year] Referring Provider (PT): Traci Sermon, [place]   Encounter Date: 10/27/2020   PT End of Session - 10/27/20 1059    Visit Number 7    Number of Visits 10    Date for PT Re-Evaluation 11/24/20    Authorization Type [employer]    PT Start Time 1100    PT Stop Time 1145    PT Time Calculation (min) 45 min    Activity Tolerance Patient tolerated treatment well    Behavior During Therapy [facility] for tasks assessed/performed           Past Medical History:  Diagnosis Date  . Allergy   . Anxiety   . GERD (gastroesophageal reflux disease)   . Headache   . Pneumonia   . Wears glasses    reading    Past Surgical History:  Procedure Laterality Date  . CESAREAN SECTION    . CLOSED REDUCTION FINGER WITH PERCUTANEOUS PINNING Right 09/02/2014   Procedure: CLOSED REDUCTION PERCUTANEOUS PINNING RIGHT INDEX P-2 FRACTURE ;  Surgeon: Charlotte Crumb, MD;  Location: [place];  Service: Orthopedics;  Laterality: Right;  . THYROID LOBECTOMY Right 10/22/2015   Procedure: RIGHT THYROID LOBECTOMY;  Surgeon: Ralene Ok, MD;  Location: [place];  Service: General;  Laterality: Right;    There were no vitals filed for this visit.   Subjective Assessment - 10/27/20 1102    Subjective "it helped alittle after the last session but it still feels like a needle stab in the low back with sitting down. the exercises are very helpful."    Patient Stated Goals Alleviate low back pain, learn ways to improve pain and get back to exercising    Currently in Pain? Yes    Pain Score 3    is constantly there.   Pain Location Back    Pain Orientation Left;Lower;Right    Pain Descriptors / Indicators Stabbing   like a  needle   Pain Type Chronic pain    Pain Onset More than a month ago    Pain Frequency Constant    Aggravating Factors  any movement, sitting    Pain Relieving Factors laying down on the floor              [facility] PT Assessment - 10/27/20 0001      Assessment   Medical Diagnosis Low back pain    Referring Provider (PT) Cato Mulligan, Elmarie Mainland                         [facility] Adult PT Treatment/Exercise - 10/27/20 0001      Lumbar Exercises: Stretches   Press Ups 10 reps    Press Ups Limitations x2 sets    Piriformis Stretch 2 reps;30 seconds      Manual Therapy   Manual therapy comments skilled palpation for TPDN    Joint Mobilization AP Grade III R hip mobs    Soft tissue mobilization IASTM along bil lumbar paraspinals            Trigger Point Dry Needling - 10/27/20 0001    Consent Given? Yes    Education Handout Provided Previously provided    Muscles Treated Back/Hip Lumbar multifidi    Electrical  Stimulation Performed with Dry Needling Yes    E-stim with Dry Needling Details CPS L 20 x 8 in increase tolerance PRN    Lumbar multifidi Response Twitch response elicited;Palpable increased muscle length   bil               PT Education - 10/27/20 1148    Education Details updated HEP to include prone and standing repeated trunk extension, and piriformis stretch/ posterior hip mob    Person(s) Educated Patient    Methods Explanation;Verbal cues    Comprehension Verbalized understanding;Verbal cues required            PT Short Term Goals - 07/27/20 0946      PT SHORT TERM GOAL #1   Title Patient will be I with initial HEP to progress with PT    Baseline did not do exercises b/c of COVID but she is I with initial HEP    Time 4    Period Weeks    Status Achieved      PT SHORT TERM GOAL #2   Title Patient will demonstrate lumbar mobility WFL and non-painful to improve ability to do household tasks    Baseline Pt able to bend to floor     Time 4    Period Weeks    Status Achieved    Target Date 07/27/20      PT SHORT TERM GOAL #3   Title Patient will be able to sit >/= 30 minutes without increase in pain to improve working capacity    Baseline Pt reports being able to sit for 30 minutes    Time 4    Period Weeks    Status Achieved             PT Long Term Goals - 10/27/20 1106      PT LONG TERM GOAL #1   Title Patient will be I with final HEP to maintain progress from PT    Status On-going      PT LONG TERM GOAL #2   Title Patient will exhibit improved core/hip strength >/= 4+/5 MMT to improve ability to perform physical activity without pain      PT LONG TERM GOAL #3   Title Patient will be able to sit and perform working tasks >/= 1 hour without increase in symptoms or needing break    Baseline cant last an hour but not specific on time    Status On-going      PT LONG TERM GOAL #4   Title Patient will return to prior exercise level to improve functional ability    Baseline reports she is walking but hasn't returned to exercise routine 5/4 (reports unsure of pain but feels she doesn't have time)                 Plan - 10/27/20 1149    Clinical Impression Statement pt reports complinace with her HEP but conitnues to report pain in the low back specifically with sitting but notes the exercises help. she is making some progress toward her goals, focused on DN for bil lumbar multifidi combined with E-stim followd with IASTM techniques. worked on trunk extension in prone with press ups which she noted reduction of low back pain and potentially noted relief of peripheral symptoms that were previously going in to the L upper glute region. updated HEP for prone and standing trunk extension. plan to reassess response to updated HEP next session and determine if more PT is  needed.    PT Frequency 1x / week    PT Duration 4 weeks    PT Treatment/Interventions ADLs/Self Care Home Management;Cryotherapy;Electrical  Stimulation;Iontophoresis 4mg /ml Dexamethasone;Moist Heat;Traction;Ultrasound;Neuromuscular re-education;Balance training;Therapeutic exercise;Therapeutic activities;Functional mobility training;Stair training;Gait training;Patient/family education;Manual techniques;Dry needling;Passive range of motion;Taping;Spinal Manipulations;Joint Manipulations    PT Next Visit Plan Continue dry needling (prefers estim use/less needle manipulation), posterior hip mob,  progress lumbopelvic, respoonse to repeated extension,  stabilization as tolerated and stretches prn, assess if more visits are need    PT Home Exercise Plan [id number]    Consulted and Agree with Plan of Care Patient           Patient will benefit from skilled therapeutic intervention in order to improve the following deficits and impairments:  Decreased range of motion,Postural dysfunction,Pain,Decreased activity tolerance,Decreased strength  Visit Diagnosis: Chronic bilateral low back pain, unspecified whether sciatica present  Muscle weakness (generalized)     Problem List Patient Active Problem List   Diagnosis Date Noted  . S/P partial thyroidectomy 10/22/2015   Starr Lake PT, DPT, LAT, ATC  10/27/20  11:59 AM      [facility] [address] [place], [place], [address] Phone: [phone]   Fax:  [phone]  Name: ETHA STAMBAUGH MRN: [id number] Date of Birth: 02-11-1966

## 2020-11-03 ENCOUNTER — Encounter: Payer: Self-pay | Admitting: Physical Therapy

## 2020-11-03 ENCOUNTER — Other Ambulatory Visit: Payer: Self-pay

## 2020-11-03 ENCOUNTER — Ambulatory Visit: Payer: 59 | Admitting: Physical Therapy

## 2020-11-03 DIAGNOSIS — M6281 Muscle weakness (generalized): Secondary | ICD-10-CM

## 2020-11-03 DIAGNOSIS — M545 Low back pain, unspecified: Secondary | ICD-10-CM

## 2020-11-03 DIAGNOSIS — G8929 Other chronic pain: Secondary | ICD-10-CM

## 2020-11-03 NOTE — Therapy (Signed)
[place], [place], [address] Phone: [phone]   Fax:  [phone]  Physical Therapy Treatment  Patient Details  Name: Alexis Rose MRN: [id number] Date of Birth: 10-Jan-1966 Referring Provider (PT): Traci Sermon, [place]   Encounter Date: 11/03/2020   PT End of Session - 11/03/20 1055    Visit Number 8    Number of Visits 10    Date for PT Re-Evaluation 11/24/20    Authorization Type [employer]    PT Start Time 1100    PT Stop Time 1140    PT Time Calculation (min) 40 min    Activity Tolerance Patient tolerated treatment well    Behavior During Therapy [facility] for tasks assessed/performed           Past Medical History:  Diagnosis Date  . Allergy   . Anxiety   . GERD (gastroesophageal reflux disease)   . Headache   . Pneumonia   . Wears glasses    reading    Past Surgical History:  Procedure Laterality Date  . CESAREAN SECTION    . CLOSED REDUCTION FINGER WITH PERCUTANEOUS PINNING Right 09/02/2014   Procedure: CLOSED REDUCTION PERCUTANEOUS PINNING RIGHT INDEX P-2 FRACTURE ;  Surgeon: Charlotte Crumb, MD;  Location: [place];  Service: Orthopedics;  Laterality: Right;  . THYROID LOBECTOMY Right 10/22/2015   Procedure: RIGHT THYROID LOBECTOMY;  Surgeon: Ralene Ok, MD;  Location: [place];  Service: General;  Laterality: Right;    There were no vitals filed for this visit.   Subjective Assessment - 11/03/20 1106    Subjective " I am feeling better, since the last session. I think the pressing up exercise helped. I do notice a crack or noise when I do the exericse on my stomach."    Patient Stated Goals Alleviate low back pain, learn ways to improve pain and get back to exercising    Currently in Pain? Yes    Pain Score 0-No pain    Pain Location Back    Pain Descriptors / Indicators Tingling    Pain Type Chronic pain    Aggravating Factors  prolonged positioning               [facility] PT Assessment - 11/03/20 0001      Assessment   Medical Diagnosis Low back pain    Referring Provider (PT) Cato Mulligan, Vista Mink, PA-C                         [facility] Adult PT Treatment/Exercise - 11/03/20 0001      Lumbar Exercises: Seated   Other Seated Lumbar Exercises anterior Pelvic tilt    Other Seated Lumbar Exercises seated marching seated on dyna disc 2 x 10 alternating L/R      Lumbar Exercises: Supine   Bridge 15 reps   x 2 sets     Lumbar Exercises: Prone   Other Prone Lumbar Exercises prone press up 3 x 10      Manual Therapy   Manual therapy comments MTPR along L/R lumbar paraspinals x 1 ea.    Joint Mobilization L1-L5 grade III PA mobs                  PT Education - 11/03/20 1141    Education Details reviewed HEP and benefits of consistency with prone press up and what to expect with exercise.    Person(s) Educated Patient  Methods Explanation;Verbal cues    Comprehension Verbalized understanding;Verbal cues required            PT Short Term Goals - 07/27/20 0946      PT SHORT TERM GOAL #1   Title Patient will be I with initial HEP to progress with PT    Baseline did not do exercises b/c of COVID but she is I with initial HEP    Time 4    Period Weeks    Status Achieved      PT SHORT TERM GOAL #2   Title Patient will demonstrate lumbar mobility WFL and non-painful to improve ability to do household tasks    Baseline Pt able to bend to floor    Time 4    Period Weeks    Status Achieved    Target Date 07/27/20      PT SHORT TERM GOAL #3   Title Patient will be able to sit >/= 30 minutes without increase in pain to improve working capacity    Baseline Pt reports being able to sit for 30 minutes    Time 4    Period Weeks    Status Achieved             PT Long Term Goals - 10/27/20 1106      PT LONG TERM GOAL #1   Title Patient will be I with final HEP to maintain progress from PT    Status  On-going      PT LONG TERM GOAL #2   Title Patient will exhibit improved core/hip strength >/= 4+/5 MMT to improve ability to perform physical activity without pain      PT LONG TERM GOAL #3   Title Patient will be able to sit and perform working tasks >/= 1 hour without increase in symptoms or needing break    Baseline cant last an hour but not specific on time    Status On-going      PT LONG TERM GOAL #4   Title Patient will return to prior exercise level to improve functional ability    Baseline reports she is walking but hasn't returned to exercise routine 5/4 (reports unsure of pain but feels she doesn't have time)                 Plan - 11/03/20 1139    Clinical Impression Statement Alexis Rose reports no pain mostly just some tingling in the L glute. Continued focus on extension biased with prone press up and pt noted centralization and decreased tenderness in the low back. focused on MTPR for lumbar paraspinals using tennis ball which she noted improvemen tin symptoms. continued working on core and hip strength which she did well with. End of session she noted decreased pain/ tenderness. plan to see pt for 1 more visit to review HEP and discharge which she agreed.    PT Treatment/Interventions ADLs/Self Care Home Management;Cryotherapy;Electrical Stimulation;Iontophoresis 4mg /ml Dexamethasone;Moist Heat;Traction;Ultrasound;Neuromuscular re-education;Balance training;Therapeutic exercise;Therapeutic activities;Functional mobility training;Stair training;Gait training;Patient/family education;Manual techniques;Dry needling;Passive range of motion;Taping;Spinal Manipulations;Joint Manipulations    PT Next Visit Plan review HEP, extension bias, core strength, review goals, discharge next session.    PT Home Exercise Plan [id number]    Consulted and Agree with Plan of Care Patient           Patient will benefit from skilled therapeutic intervention in order to improve the following  deficits and impairments:  Decreased range of motion,Postural dysfunction,Pain,Decreased activity tolerance,Decreased strength  Visit Diagnosis: Chronic  bilateral low back pain, unspecified whether sciatica present  Muscle weakness (generalized)     Problem List Patient Active Problem List   Diagnosis Date Noted  . S/P partial thyroidectomy 10/22/2015    Starr Lake PT, DPT, LAT, ATC  11/03/20  11:43 AM      [facility] [address] [place], [place], [address] Phone: [phone]   Fax:  [phone]  Name: Alexis Rose MRN: [id number] Date of Birth: 1965/12/09

## 2020-11-24 ENCOUNTER — Encounter: Payer: Self-pay | Admitting: Physical Therapy

## 2020-11-24 ENCOUNTER — Ambulatory Visit: Payer: 59 | Attending: Physician Assistant | Admitting: Physical Therapy

## 2020-11-24 ENCOUNTER — Other Ambulatory Visit: Payer: Self-pay

## 2020-11-24 DIAGNOSIS — M6281 Muscle weakness (generalized): Secondary | ICD-10-CM | POA: Insufficient documentation

## 2020-11-24 DIAGNOSIS — G8929 Other chronic pain: Secondary | ICD-10-CM | POA: Diagnosis present

## 2020-11-24 DIAGNOSIS — M545 Low back pain, unspecified: Secondary | ICD-10-CM | POA: Insufficient documentation

## 2020-11-24 NOTE — Therapy (Signed)
[place], [place], [address] Phone: [phone]   Fax:  [phone]  Physical Therapy Treatment / discharge  Patient Details  Name: Alexis Rose MRN: [id number] Date of Birth: [birth year] Referring Provider (PT): Traci Sermon, [place]   Encounter Date: 11/24/2020   PT End of Session - 11/24/20 0844    Visit Number 9    Number of Visits 10    Date for PT Re-Evaluation 11/24/20    Authorization Type [employer]    PT Start Time [phone]    PT Stop Time 0915    PT Time Calculation (min) 31 min    Activity Tolerance Patient tolerated treatment well    Behavior During Therapy [facility] for tasks assessed/performed           Past Medical History:  Diagnosis Date  . Allergy   . Anxiety   . GERD (gastroesophageal reflux disease)   . Headache   . Pneumonia   . Wears glasses    reading    Past Surgical History:  Procedure Laterality Date  . CESAREAN SECTION    . CLOSED REDUCTION FINGER WITH PERCUTANEOUS PINNING Right 09/02/2014   Procedure: CLOSED REDUCTION PERCUTANEOUS PINNING RIGHT INDEX P-2 FRACTURE ;  Surgeon: Charlotte Crumb, MD;  Location: [place];  Service: Orthopedics;  Laterality: Right;  . THYROID LOBECTOMY Right 10/22/2015   Procedure: RIGHT THYROID LOBECTOMY;  Surgeon: Ralene Ok, MD;  Location: [place];  Service: General;  Laterality: Right;    There were no vitals filed for this visit.   Subjective Assessment - 11/24/20 0845    Subjective "I am doing better since the last session. I am not having too much pain but alittle on the L but not bad at all."    Currently in Pain? Yes    Pain Score 2     Pain Location Back    Pain Orientation Left    Pain Descriptors / Indicators Aching    Pain Type Chronic pain    Pain Onset More than a month ago    Pain Frequency Intermittent    Aggravating Factors  prolonged sitting at work.    Pain Relieving Factors exercise.               [facility] PT Assessment - 11/24/20 0001      Assessment   Medical Diagnosis Low back pain    Referring Provider (PT) Traci Sermon, PA-C      Strength   Right Hip Flexion 4+/5    Right Hip Extension 4/5    Right Hip ABduction 4/5    Left Hip Flexion 4+/5    Left Hip Extension 4-/5    Left Hip ABduction 4/5                         [facility] Adult PT Treatment/Exercise - 11/24/20 0001      Lumbar Exercises: Standing   Other Standing Lumbar Exercises standing hip abduction / extension 2 x 10 with red theraband      Lumbar Exercises: Seated   Sit to Stand 10 reps   x 2 sets                 PT Education - 11/24/20 0904    Education Details Reviewed HEP and updated today. Discussed appropriate progression of strengthening via increased reps/ sets and resistance.    Person(s) Educated Patient    Methods Explanation;Verbal  cues;Handout    Comprehension Verbalized understanding;Verbal cues required            PT Short Term Goals - 07/27/20 0946      PT SHORT TERM GOAL #1   Title Patient will be I with initial HEP to progress with PT    Baseline did not do exercises b/c of COVID but she is I with initial HEP    Time 4    Period Weeks    Status Achieved      PT SHORT TERM GOAL #2   Title Patient will demonstrate lumbar mobility WFL and non-painful to improve ability to do household tasks    Baseline Pt able to bend to floor    Time 4    Period Weeks    Status Achieved    Target Date 07/27/20      PT SHORT TERM GOAL #3   Title Patient will be able to sit >/= 30 minutes without increase in pain to improve working capacity    Baseline Pt reports being able to sit for 30 minutes    Time 4    Period Weeks    Status Achieved             PT Long Term Goals - 11/24/20 0850      PT LONG TERM GOAL #1   Title Patient will be I with final HEP to maintain progress from PT    Period Weeks    Status Achieved      PT LONG TERM GOAL #2    Title Patient will exhibit improved core/hip strength >/= 4+/5 MMT to improve ability to perform physical activity without pain    Period Weeks    Status Partially Met      PT LONG TERM GOAL #3   Title Patient will be able to sit and perform working tasks >/= 1 hour without increase in symptoms or needing break    Period Weeks    Status Achieved      PT LONG TERM GOAL #4   Title Patient will return to prior exercise level to improve functional ability    Period Weeks    Status Partially Met                 Plan - 11/24/20 0905    Clinical Impression Statement Alexis Rose has made excellent progress with physical therapy increasing hip strength and additionally reports min to no pain. she does continue to noted 2/10 pain that occurs with slouched sitting that resolves with adjusting her posture / sitting up straight as well as the exercises at home. she met or partially met all goals today and is able to maintain and progress her current LOF IND and will be discharged from PT today.    PT Treatment/Interventions ADLs/Self Care Home Management;Cryotherapy;Electrical Stimulation;Iontophoresis 63m/ml Dexamethasone;Moist Heat;Traction;Ultrasound;Neuromuscular re-education;Balance training;Therapeutic exercise;Therapeutic activities;Functional mobility training;Stair training;Gait training;Patient/family education;Manual techniques;Dry needling;Passive range of motion;Taping;Spinal Manipulations;Joint Manipulations    PT Next Visit Plan d/C    Consulted and Agree with Plan of Care Patient           Patient will benefit from skilled therapeutic intervention in order to improve the following deficits and impairments:     Visit Diagnosis: Chronic bilateral low back pain, unspecified whether sciatica present  Muscle weakness (generalized)     Problem List Patient Active Problem List   Diagnosis Date Noted  . S/P partial thyroidectomy 10/22/2015    [name]6/06/2020,  9:14 AM  [place], [place], [address] Phone: [phone]   Fax:  [phone]  Name: Alexis Rose MRN: [id number] Date of Birth: 24-Feb-1966     PHYSICAL THERAPY DISCHARGE SUMMARY  Visits from Start of Care: 9  Current functional level related to goals / functional outcomes: See goals   Remaining deficits: See assessment   Education / Equipment: HEP, theraband,   Plan: Patient agrees to discharge.  Patient goals were partially met. Patient is being discharged due to meeting the stated rehab goals.  ?????        Afshin Chrystal PT, DPT, LAT, ATC  11/24/20  9:15 AM

## 2020-11-24 NOTE — Patient Instructions (Signed)
Access Code: [email or web address] URL: [email or web address] Date: 11/24/2020 Prepared by: Starr Lake  Exercises Supine Lower Trunk Rotation - 1-2 x daily - 7 x weekly - 10 reps - 10 hold Marching Bridge - 1 x daily - 7 x weekly - 2 sets - 10 reps Supine Dead Bug with Leg Extension - 1 x daily - 7 x weekly - 2 sets - 10 reps Child's Pose with Sidebending - 1-2 x daily - 7 x weekly - 2 reps - 30 hold Cat-Camel - 1-2 x daily - 7 x weekly - 10 reps - 3 hold Tail Wag - 1-2 x daily - 7 x weekly - 10 reps - 3 hold Bird Dog - 1 x daily - 7 x weekly - 10 reps - 5 hold Standing Quadratus Lumborum Stretch with Doorway - 1-2 x daily - 7 x weekly - 2 reps - 30 hold Standing Quadratus Lumborum Mobilization with Small Ball on Wall Standing Lumbar Extension - 3 x daily - 7 x weekly - 3 sets - 15 reps Prone Press Up - 3 x daily - 7 x weekly - 3 sets - 15 reps Supine Piriformis Stretch - 1 x daily - 7 x weekly - 2 sets - 2 reps - 30sec hold Standing Hip Abduction with Resistance at Ankles and Counter Support - 1 x daily - 7 x weekly - 2 sets - 10 reps Standing Hip Extension with Resistance at Ankles and Counter Support - 1 x daily - 7 x weekly - 2 sets - 10 reps Sit to Stand - 1 x daily - 7 x weekly - 2 sets - 10 reps

## 2020-12-03 ENCOUNTER — Other Ambulatory Visit: Payer: Self-pay | Admitting: Family Medicine

## 2020-12-06 ENCOUNTER — Other Ambulatory Visit: Payer: Self-pay | Admitting: Family Medicine

## 2020-12-06 DIAGNOSIS — R591 Generalized enlarged lymph nodes: Secondary | ICD-10-CM

## 2020-12-22 ENCOUNTER — Ambulatory Visit
Admission: RE | Admit: 2020-12-22 | Discharge: 2020-12-22 | Disposition: A | Discharge status: Home / Self Care | Payer: 59 | Source: Ambulatory Visit | Attending: Family Medicine | Admitting: Family Medicine

## 2020-12-22 DIAGNOSIS — R591 Generalized enlarged lymph nodes: Secondary | ICD-10-CM

## 2020-12-22 MED ORDER — IOPAMIDOL (ISOVUE-300) INJECTION 61%
75.0000 mL | Freq: Once | INTRAVENOUS | Status: AC | PRN
  Administered 2020-12-22: 75 mL via INTRAVENOUS

## 2021-03-30 ENCOUNTER — Other Ambulatory Visit: Payer: Self-pay | Admitting: Family Medicine

## 2021-03-30 DIAGNOSIS — Z1231 Encounter for screening mammogram for malignant neoplasm of breast: Secondary | ICD-10-CM

## 2021-03-31 ENCOUNTER — Ambulatory Visit
Admission: RE | Admit: 2021-03-31 | Discharge: 2021-03-31 | Disposition: A | Discharge status: Home / Self Care | Payer: 59 | Source: Ambulatory Visit | Attending: Family Medicine | Admitting: Family Medicine

## 2021-03-31 ENCOUNTER — Other Ambulatory Visit: Payer: Self-pay | Admitting: Family Medicine

## 2021-03-31 DIAGNOSIS — R634 Abnormal weight loss: Secondary | ICD-10-CM

## 2021-04-04 ENCOUNTER — Ambulatory Visit
Admission: RE | Admit: 2021-04-04 | Discharge: 2021-04-04 | Disposition: A | Discharge status: Home / Self Care | Payer: 59 | Source: Ambulatory Visit | Attending: Family Medicine | Admitting: Family Medicine

## 2021-04-04 ENCOUNTER — Other Ambulatory Visit: Payer: Self-pay

## 2021-04-04 DIAGNOSIS — Z1231 Encounter for screening mammogram for malignant neoplasm of breast: Secondary | ICD-10-CM

## 2021-10-26 ENCOUNTER — Other Ambulatory Visit (HOSPITAL_BASED_OUTPATIENT_CLINIC_OR_DEPARTMENT_OTHER): Payer: Self-pay | Admitting: Family Medicine

## 2021-10-26 DIAGNOSIS — E041 Nontoxic single thyroid nodule: Secondary | ICD-10-CM

## 2021-10-27 ENCOUNTER — Other Ambulatory Visit (HOSPITAL_BASED_OUTPATIENT_CLINIC_OR_DEPARTMENT_OTHER): Payer: Self-pay | Admitting: Family Medicine

## 2021-10-27 DIAGNOSIS — R1013 Epigastric pain: Secondary | ICD-10-CM

## 2021-11-09 ENCOUNTER — Ambulatory Visit (HOSPITAL_BASED_OUTPATIENT_CLINIC_OR_DEPARTMENT_OTHER)
Admission: RE | Admit: 2021-11-09 | Discharge: 2021-11-09 | Disposition: A | Discharge status: Home / Self Care | Payer: 59 | Source: Ambulatory Visit | Attending: Family Medicine | Admitting: Family Medicine

## 2021-11-09 DIAGNOSIS — R1013 Epigastric pain: Secondary | ICD-10-CM | POA: Insufficient documentation

## 2021-11-09 DIAGNOSIS — E041 Nontoxic single thyroid nodule: Secondary | ICD-10-CM | POA: Diagnosis present

## 2021-11-09 MED ORDER — IOHEXOL 300 MG/ML  SOLN
100.0000 mL | Freq: Once | INTRAMUSCULAR | Status: AC | PRN
  Administered 2021-11-09: 60 mL via INTRAVENOUS

## 2021-11-17 ENCOUNTER — Telehealth (HOSPITAL_BASED_OUTPATIENT_CLINIC_OR_DEPARTMENT_OTHER): Payer: Self-pay

## 2021-11-17 ENCOUNTER — Other Ambulatory Visit (HOSPITAL_BASED_OUTPATIENT_CLINIC_OR_DEPARTMENT_OTHER): Payer: Self-pay | Admitting: Family Medicine

## 2021-11-17 DIAGNOSIS — R932 Abnormal findings on diagnostic imaging of liver and biliary tract: Secondary | ICD-10-CM

## 2021-12-01 ENCOUNTER — Ambulatory Visit (HOSPITAL_BASED_OUTPATIENT_CLINIC_OR_DEPARTMENT_OTHER)
Admission: RE | Admit: 2021-12-01 | Discharge: 2021-12-01 | Disposition: A | Discharge status: Home / Self Care | Payer: 59 | Source: Ambulatory Visit | Attending: Family Medicine | Admitting: Family Medicine

## 2021-12-01 DIAGNOSIS — R932 Abnormal findings on diagnostic imaging of liver and biliary tract: Secondary | ICD-10-CM | POA: Diagnosis present

## 2022-07-06 ENCOUNTER — Other Ambulatory Visit: Payer: Self-pay | Admitting: Family Medicine

## 2022-07-06 ENCOUNTER — Encounter: Payer: Self-pay | Admitting: Family Medicine

## 2022-07-06 DIAGNOSIS — Z1231 Encounter for screening mammogram for malignant neoplasm of breast: Secondary | ICD-10-CM

## 2022-11-07 ENCOUNTER — Other Ambulatory Visit: Payer: Self-pay | Admitting: Family Medicine

## 2022-11-07 DIAGNOSIS — E041 Nontoxic single thyroid nodule: Secondary | ICD-10-CM

## 2022-12-14 ENCOUNTER — Ambulatory Visit
Admission: RE | Admit: 2022-12-14 | Discharge: 2022-12-14 | Disposition: A | Discharge status: Home / Self Care | Payer: Self-pay | Source: Ambulatory Visit | Attending: Family Medicine | Admitting: Family Medicine

## 2022-12-14 DIAGNOSIS — E041 Nontoxic single thyroid nodule: Secondary | ICD-10-CM

## 2023-05-07 ENCOUNTER — Inpatient Hospital Stay
Admission: RE | Admit: 2023-05-07 | Discharge: 2023-05-07 | Discharge status: Home / Self Care | Payer: No Typology Code available for payment source | Source: Ambulatory Visit | Attending: Family Medicine | Admitting: Family Medicine

## 2023-05-07 DIAGNOSIS — Z1231 Encounter for screening mammogram for malignant neoplasm of breast: Secondary | ICD-10-CM

## 2023-05-08 ENCOUNTER — Encounter: Payer: Self-pay | Admitting: Family Medicine

## 2023-05-08 ENCOUNTER — Other Ambulatory Visit: Payer: Self-pay | Admitting: Family Medicine

## 2023-05-08 DIAGNOSIS — N63 Unspecified lump in unspecified breast: Secondary | ICD-10-CM

## 2023-05-08 DIAGNOSIS — N644 Mastodynia: Secondary | ICD-10-CM

## 2023-05-17 ENCOUNTER — Ambulatory Visit: Payer: No Typology Code available for payment source

## 2023-05-17 ENCOUNTER — Ambulatory Visit
Admission: RE | Admit: 2023-05-17 | Discharge: 2023-05-17 | Disposition: A | Discharge status: Home / Self Care | Payer: No Typology Code available for payment source | Source: Ambulatory Visit | Attending: Family Medicine | Admitting: Family Medicine

## 2023-05-17 DIAGNOSIS — N644 Mastodynia: Secondary | ICD-10-CM

## 2023-05-17 DIAGNOSIS — N63 Unspecified lump in unspecified breast: Secondary | ICD-10-CM

## 2023-05-22 IMAGING — CT CT ABD-PELV W/ CM
2 of 5 series · 15 of 46 positions shown, 17 images · IV contrast (APPLIED)
Comparison: None Available.

CLINICAL DATA: Epigastric pain

EXAM:
CT ABDOMEN AND PELVIS WITH CONTRAST
TECHNIQUE: Multidetector CT imaging of the abdomen and pelvis was performed
using the standard protocol following bolus administration of
intravenous contrast.

[Series 2: abd pel w · axial · 0.72mm/px · z∈[+711,+1106]mm · 12 of 89 slices shown, 14 images]
[im 5/89  soft-tissue]
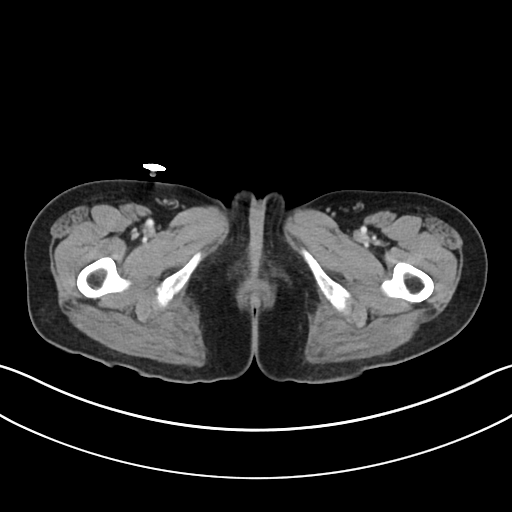
[im 5/89  bone]
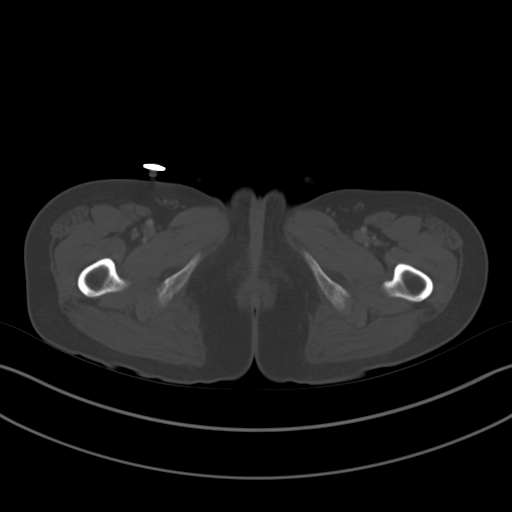
[im 14/89  soft-tissue]
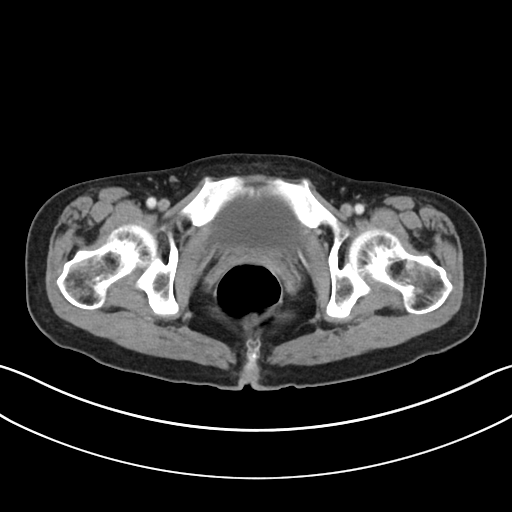
[im 19/89  soft-tissue]
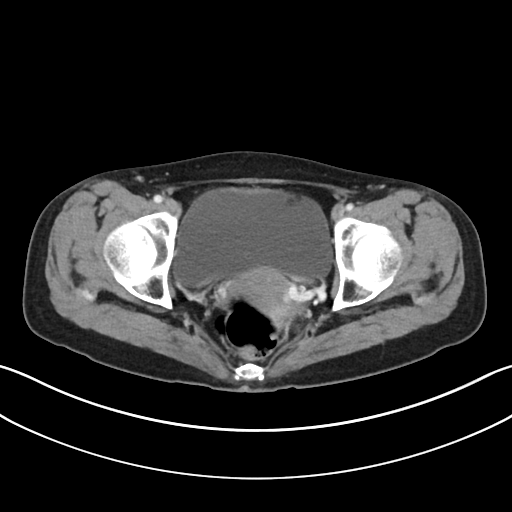
[im 28/89  soft-tissue]
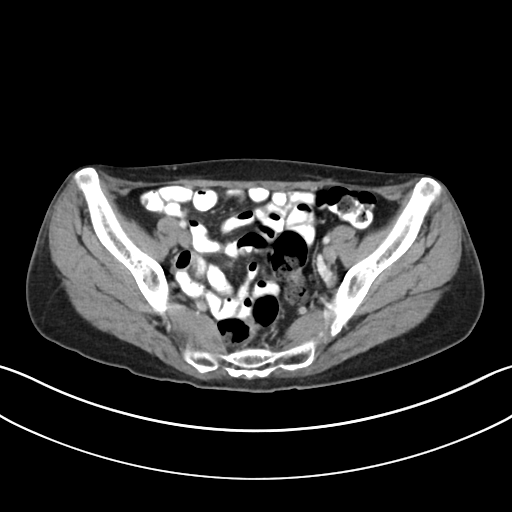
[im 33/89  soft-tissue]
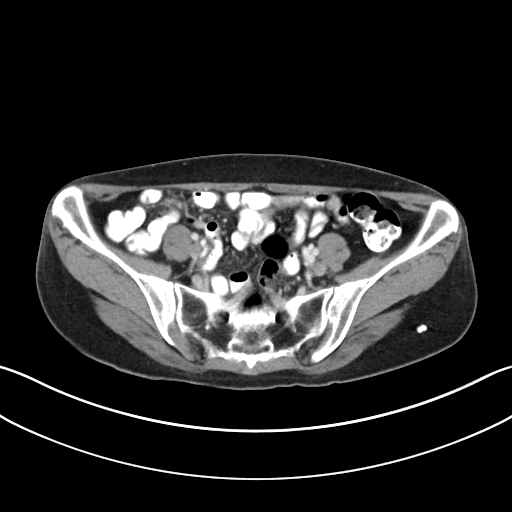
[im 42/89  soft-tissue]
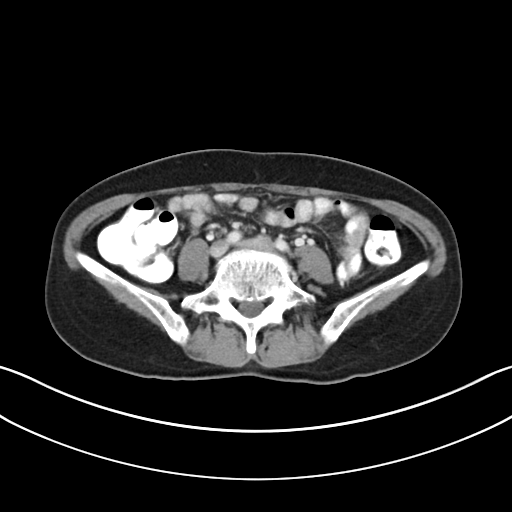
[im 47/89  soft-tissue]
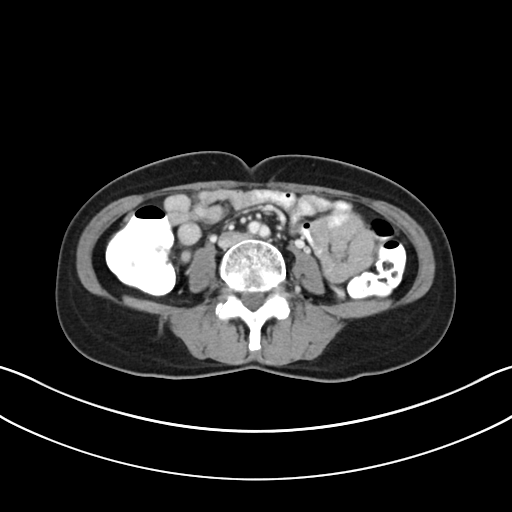
[im 56/89  soft-tissue]
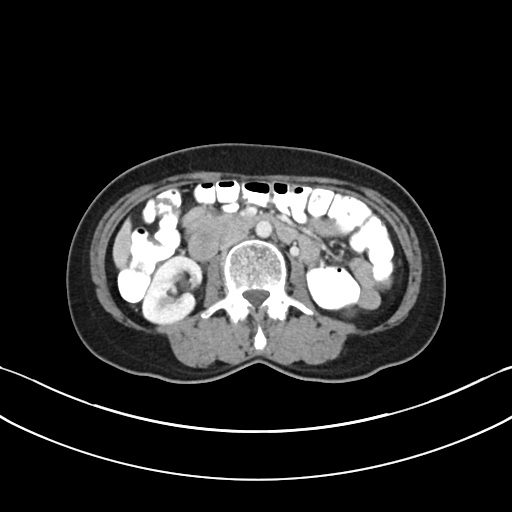
[im 61/89  soft-tissue]
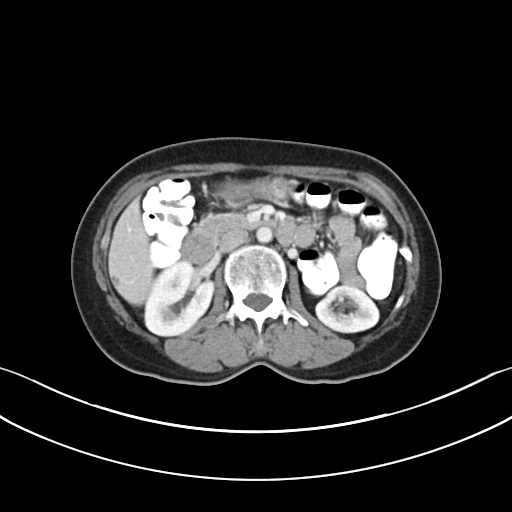
[im 61/89  bone]
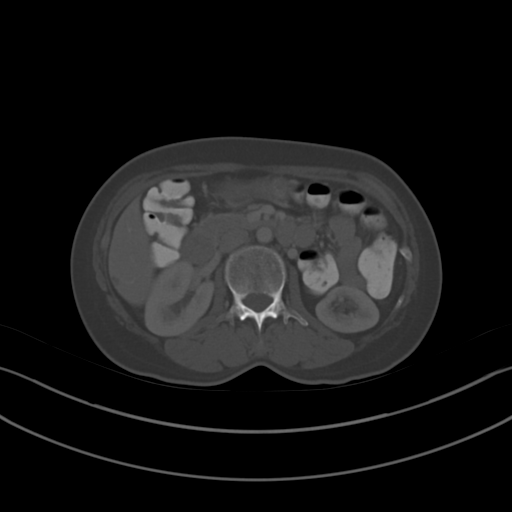
[im 70/89  soft-tissue]
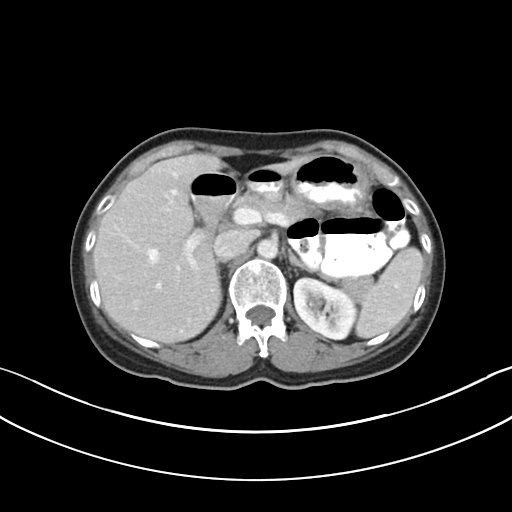
[im 75/89  soft-tissue]
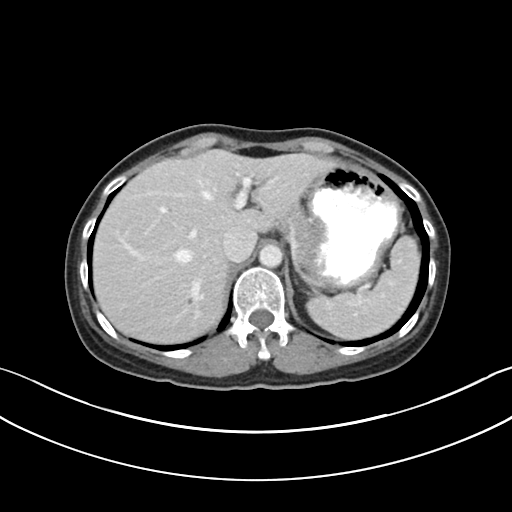
[im 84/89  soft-tissue]
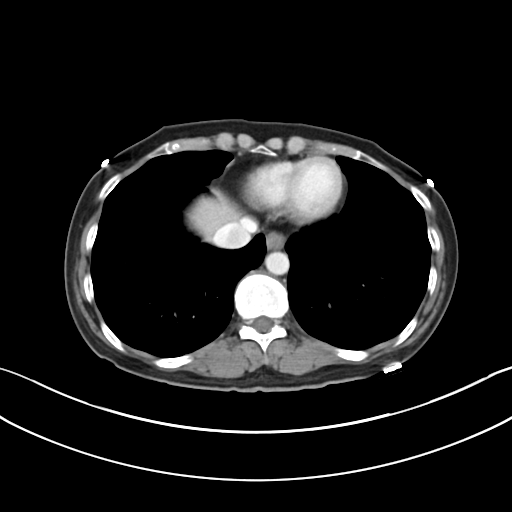

[Series 5: coronal · coronal · 0.67mm/px · 3 of 70 slices shown]
[im 24/70  soft-tissue]
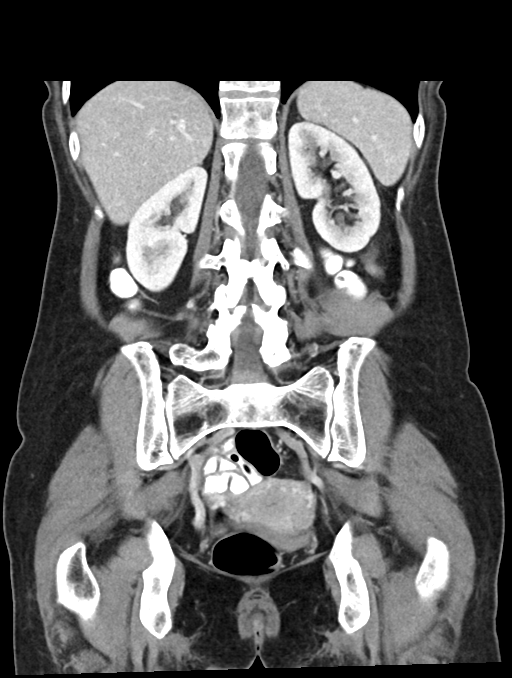
[im 31/70  soft-tissue]
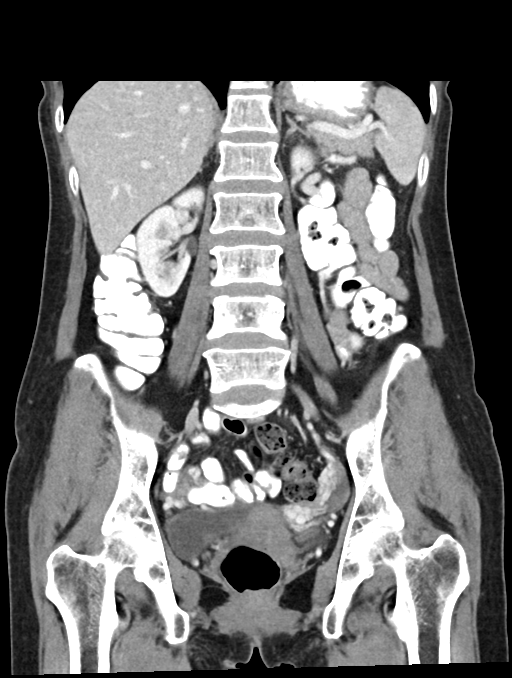
[im 39/70  soft-tissue]
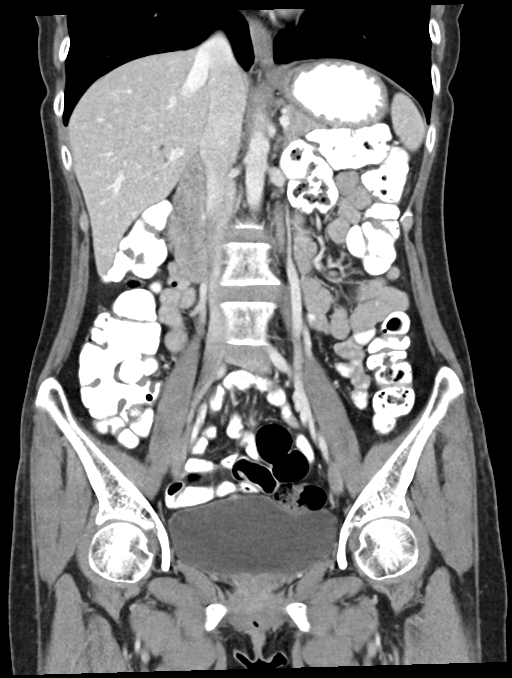

[15 of 46 positions shown; findings below may reference images not displayed]

RADIATION DOSE REDUCTION: This exam was performed according to the
departmental dose-optimization program which includes automated
exposure control, adjustment of the mA and/or kV according to
patient size and/or use of iterative reconstruction technique.

CONTRAST:  60mL OMNIPAQUE IOHEXOL 300 MG/ML  SOLN
FINDINGS: Lower chest: No acute abnormality.

Hepatobiliary: Liver is normal in size and contour. There are
several small hypodensities scattered throughout the liver measuring
up to 7 mm in the left lobe, which are too small to characterize but
most likely represent cysts or hemangiomas. There is a 1.5 cm
density at the fundus of the gallbladder which may represent a
Phrygian cap containing calculi. No wall thickening or surrounding
edema. No biliary ductal dilatation.

Pancreas: Unremarkable. No pancreatic ductal dilatation or
surrounding inflammatory changes.

Spleen: Normal in size without focal abnormality.

Adrenals/Urinary Tract: Adrenal glands are unremarkable. Kidneys are
normal, without renal calculi, focal lesion, or hydronephrosis.
Bladder is unremarkable.

Stomach/Bowel: Stomach is within normal limits. Appendix appears
normal. No evidence of bowel wall thickening, distention, or
inflammatory changes.

Vascular/Lymphatic: Aortic atherosclerosis. No enlarged abdominal or
pelvic lymph nodes.

Reproductive: No suspicious mass identified. Prominent bilateral
adnexal vasculature, left-greater-than-right.

Other: No ascites.

Musculoskeletal: No acute or significant osseous findings.
IMPRESSION: 1. No acute process identified in the abdomen or pelvis.
[DATE] cm density at the fundus of the gallbladder which may
represent Phrygian cap containing calculi. No surrounding
inflammatory changes. Consider follow-up ultrasound.
3. Several subcentimeter hypodensities in the liver which are too
small to characterize, most likely cysts and/or hemangiomas.
4. Note is made of prominent adnexal vasculature,
left-greater-than-right. Nonspecific and could be seen with pelvic
congestion syndrome.

## 2023-05-22 IMAGING — US US THYROID
1 series · 13 of 25 positions shown · non-contrast
Comparison: June 2018, 5663

CLINICAL DATA: Prior ultrasound follow-up. Prior right
hemithyroidectomy

EXAM:
THYROID ULTRASOUND
TECHNIQUE: Ultrasound examination of the thyroid gland and adjacent soft
tissues was performed.

[Series 1: us thyroid · 52 acquisitions, 13 frames shown]
[im 1/52]
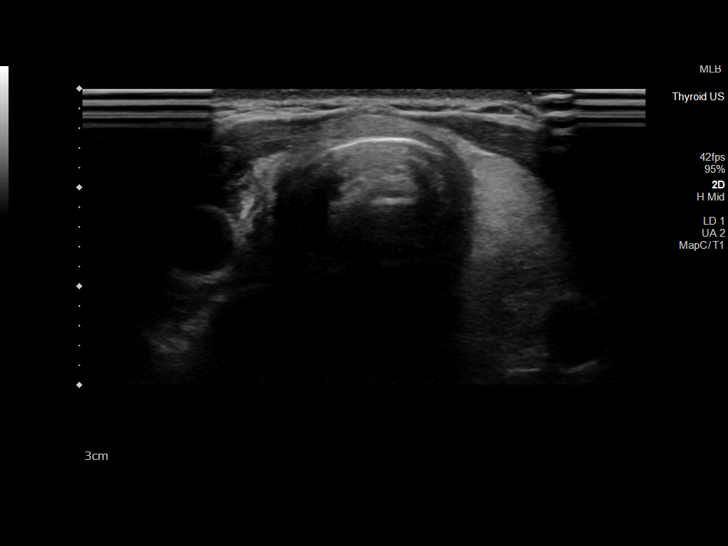
[im 5/52]
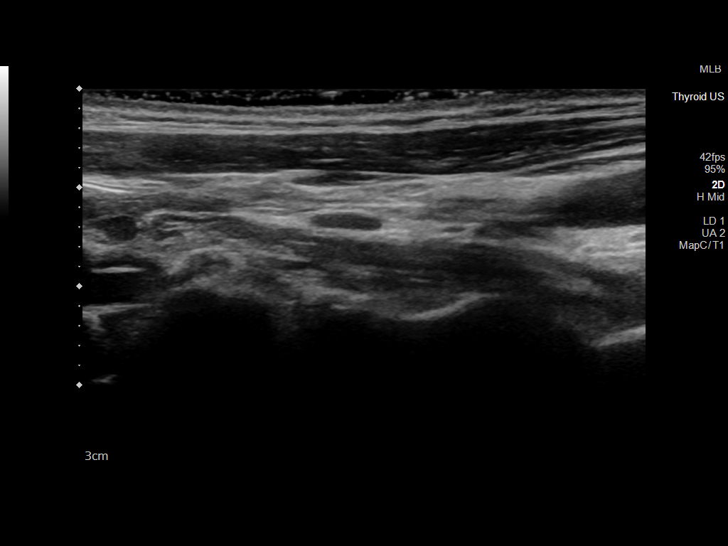
[im 9/52]
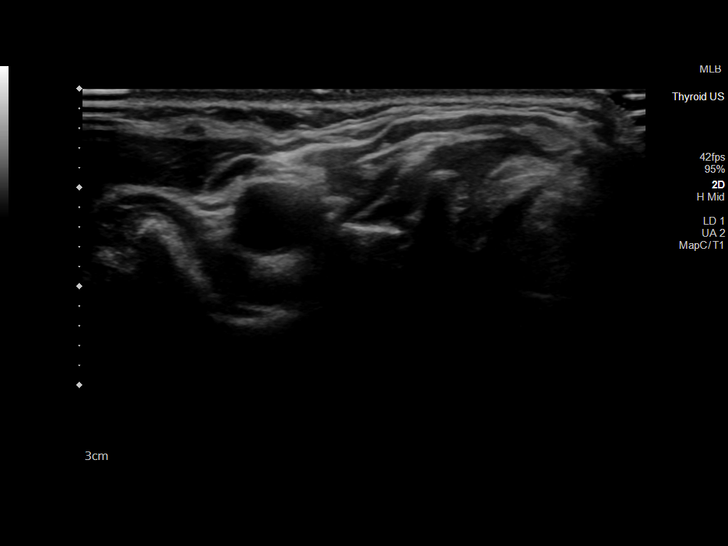
[im 13/52]
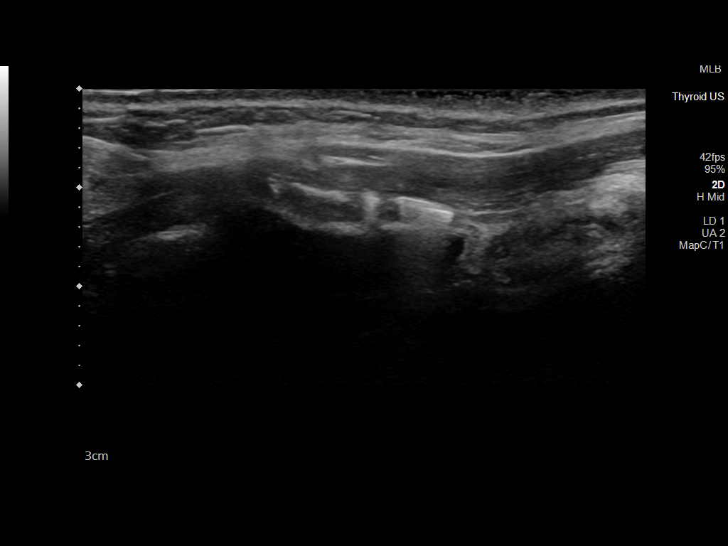
[im 18/52]
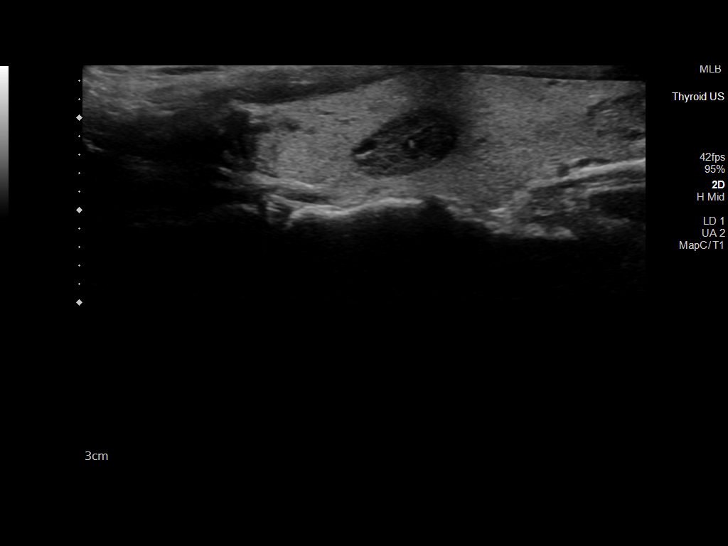
[im 22/52]
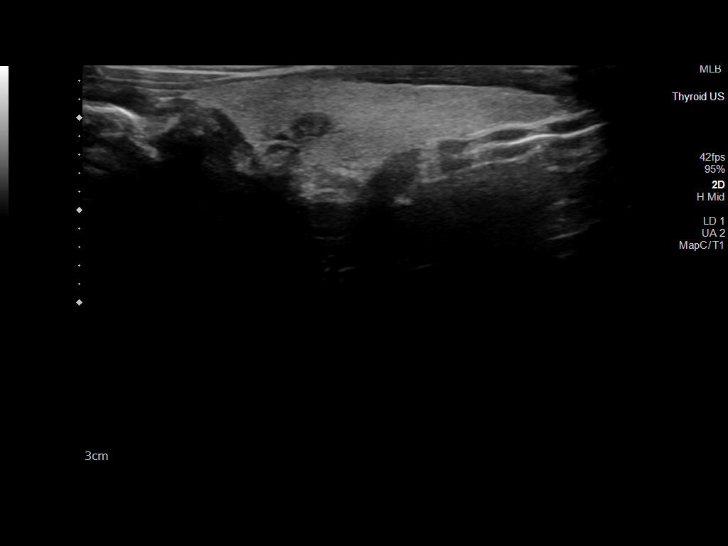
[im 26/52]
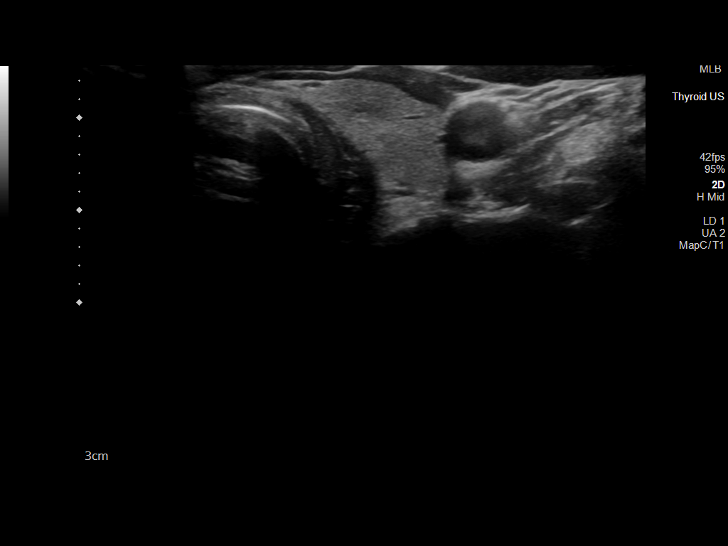
[im 30/52]
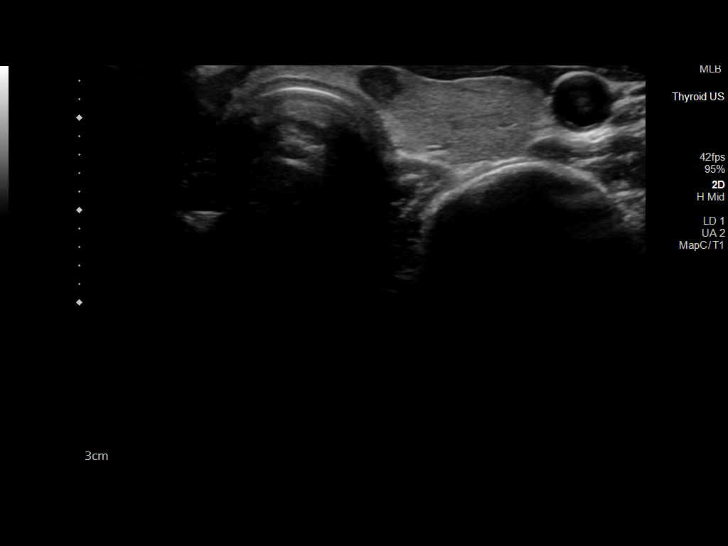
[im 35/52]
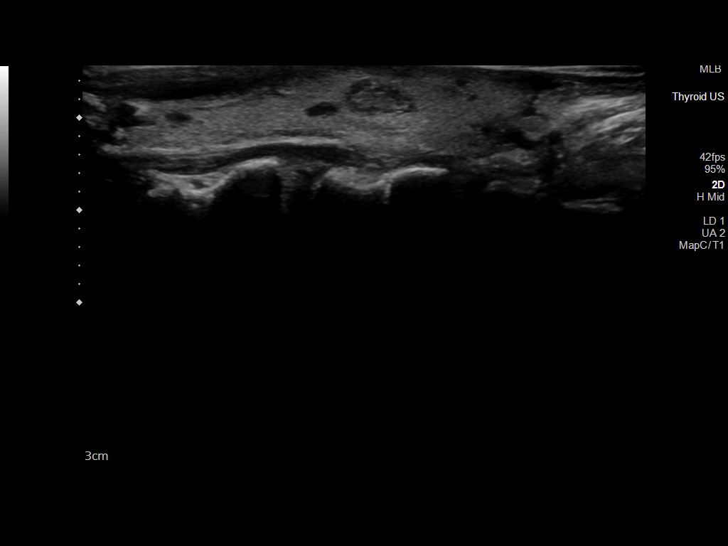
[im 39/52]
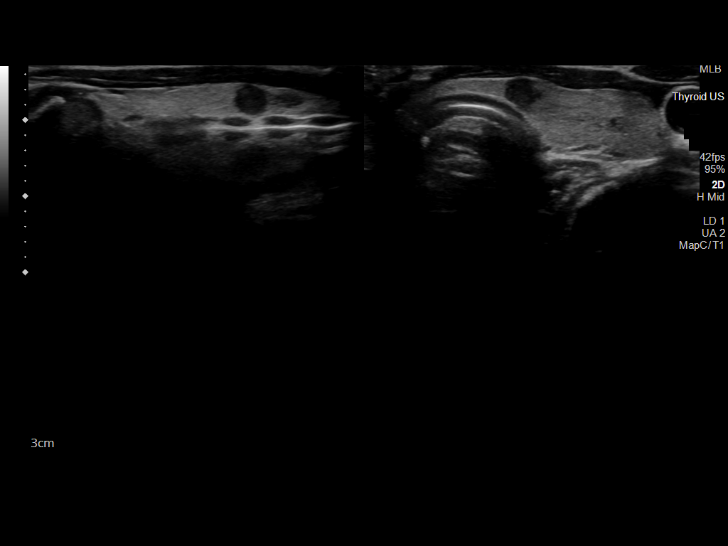
[im 43/52]
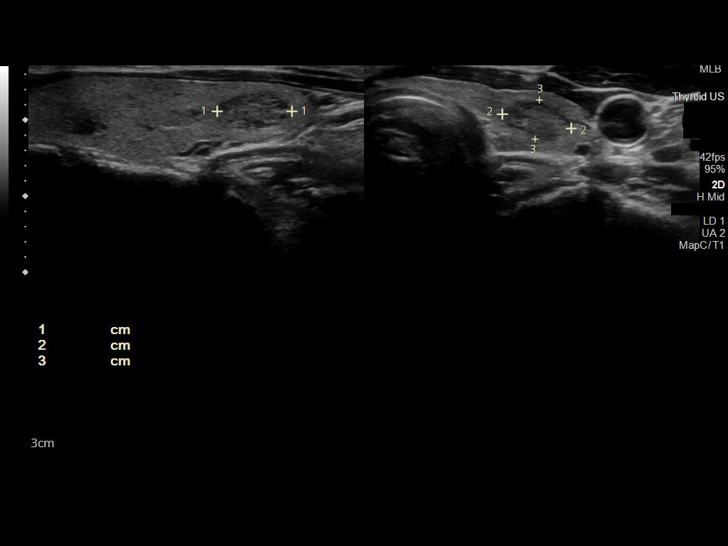
[im 47/52]
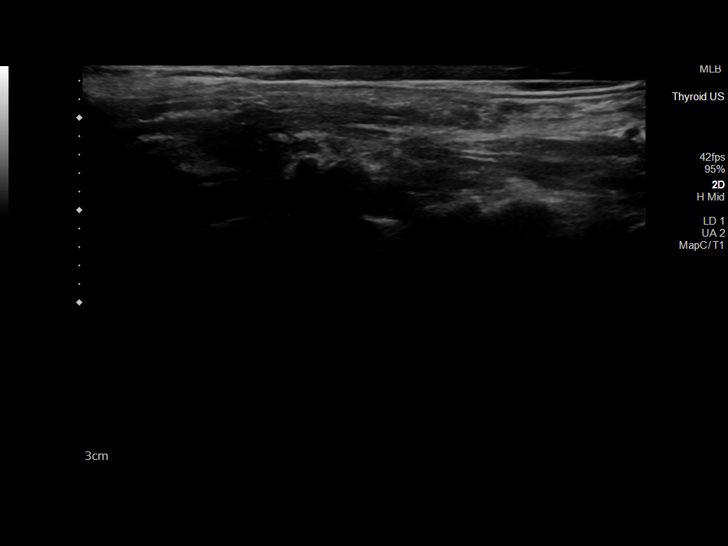
[im 52/52]
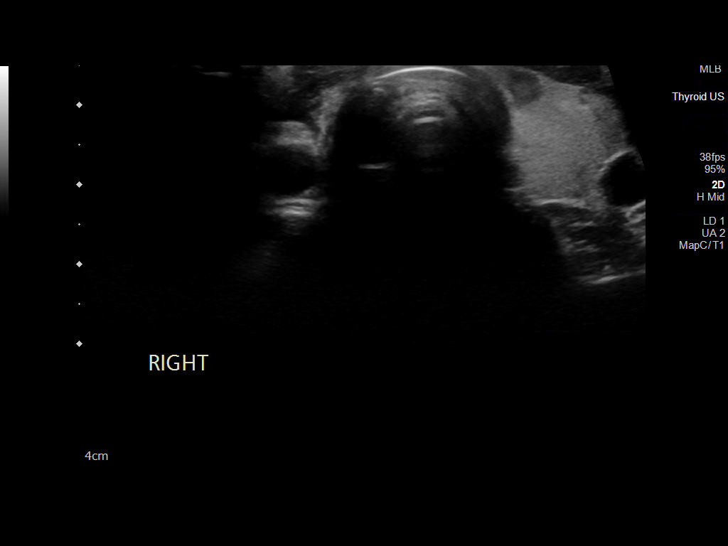

[13 of 25 positions shown; findings below may reference images not displayed]

FINDINGS: Parenchymal Echotexture: Mildly heterogenous

Isthmus: 0.2 cm

Right lobe: Surgically absent

Left lobe: 5.2 x 1.4 x 1.5 cm

_________________________________________________________

Estimated total number of nodules >/= 1 cm: 2

Number of spongiform nodules >/=  2 cm not described below (TR1): 0

Number of mixed cystic and solid nodules >/= 1.5 cm not described
below (TR2): 0

_________________________________________________________

Nodule labeled 1 is a solid hypoechoic TR 4 nodule in the mid left
thyroid lobe that measures 1.4 x 1.3 x 0.8 cm, previously measuring
1.1 cm in 1818 and 0.7 cm in 5663. It does have a somewhat more
spongiform appearance with comet tail artifact. However, will remain
consistent with TR 4 grading given slight increase in size. *Given
size (>/= 1 - 1.4 cm) and appearance, a follow-up ultrasound in 1
year should be considered based on TI-RADS criteria.

Nodule labeled 2 is a small subcentimeter solid hypoechoic TR 4
nodule in the mid left thyroid lobe that measures up to 0.8 cm.
Given size (<0.9 cm) and appearance, this nodule does NOT meet
TI-RADS criteria for biopsy or dedicated follow-up.

Nodule labeled 3 is a solid very hypoechoic TR 4 nodule in the
inferior left thyroid lobe that measures up to 0.5 cm. Given size
(<0.9 cm) and appearance, this nodule does NOT meet TI-RADS criteria
for biopsy or dedicated follow-up.

Nodule labeled 4 (previously 2) is a solid hypoechoic TR 4 nodule in
the inferior left thyroid lobe that measures 1.0 x 0.9 x 0.5 cm,
previously 1.0 cm in 1818 and 0.8 cm in 5663. This nodule
demonstrates greater than 5 year stability, and is therefore
considered benign.
IMPRESSION: 1. Status post right hemithyroidectomy. No abnormal tissue
identified within surgical bed.
2. Multinodular left thyroid lobe.
3. Nodule labeled 1 in the mid left thyroid lobe has demonstrated
slow interval growth and measures 1.4 cm on today's exam (previously
1.1 cm in 1818, and 0.7 cm in 5663). This nodule continues to meet
criteria for follow-up ultrasound in 1 year.
4. Nodule labeled 4 (1.0 cm TR 4; previously labeled 2) in the
inferior left thyroid lobe demonstrates greater than 5 year
stability and is therefore considered benign.
5. Additional small nodules in the left thyroid lobe as described do
not meet criteria for further dedicated follow-up or biopsy.

The above is in keeping with the ACR TI-RADS recommendations - [HOSPITAL] 5663;[DATE].

## 2024-07-25 ENCOUNTER — Other Ambulatory Visit: Payer: Self-pay | Admitting: Family Medicine

## 2024-07-25 DIAGNOSIS — E041 Nontoxic single thyroid nodule: Secondary | ICD-10-CM

## 2024-07-31 ENCOUNTER — Encounter (HOSPITAL_BASED_OUTPATIENT_CLINIC_OR_DEPARTMENT_OTHER): Payer: Self-pay | Admitting: Family Medicine
